# Patient Record
Sex: Female | Born: 1980
Health system: Southern US, Community
[De-identification: ages and names within clinical notes are randomized; demographics above are authoritative.]

## PROBLEM LIST (undated history)

## (undated) DIAGNOSIS — K5190 Ulcerative colitis, unspecified, without complications: Secondary | ICD-10-CM

## (undated) DIAGNOSIS — J301 Allergic rhinitis due to pollen: Secondary | ICD-10-CM

## (undated) DIAGNOSIS — K219 Gastro-esophageal reflux disease without esophagitis: Secondary | ICD-10-CM

## (undated) HISTORY — DX: Allergic rhinitis due to pollen: J30.1

## (undated) HISTORY — DX: Gastro-esophageal reflux disease without esophagitis: K21.9

## (undated) HISTORY — DX: Ulcerative colitis, unspecified, without complications: K51.90

---

## 2004-07-02 ENCOUNTER — Inpatient Hospital Stay: Payer: Self-pay

## 2007-10-21 ENCOUNTER — Emergency Department: Payer: Self-pay | Admitting: Emergency Medicine

## 2009-09-02 IMAGING — CR RIGHT ANKLE - COMPLETE 3+ VIEW
1 series · 5 of 5 positions shown · non-contrast
Comparison: none

REASON FOR EXAM: fall
COMMENTS:

[Series 1: view not recorded · 0.17mm/px · 5 of 5 slices shown]
[im 1/5]
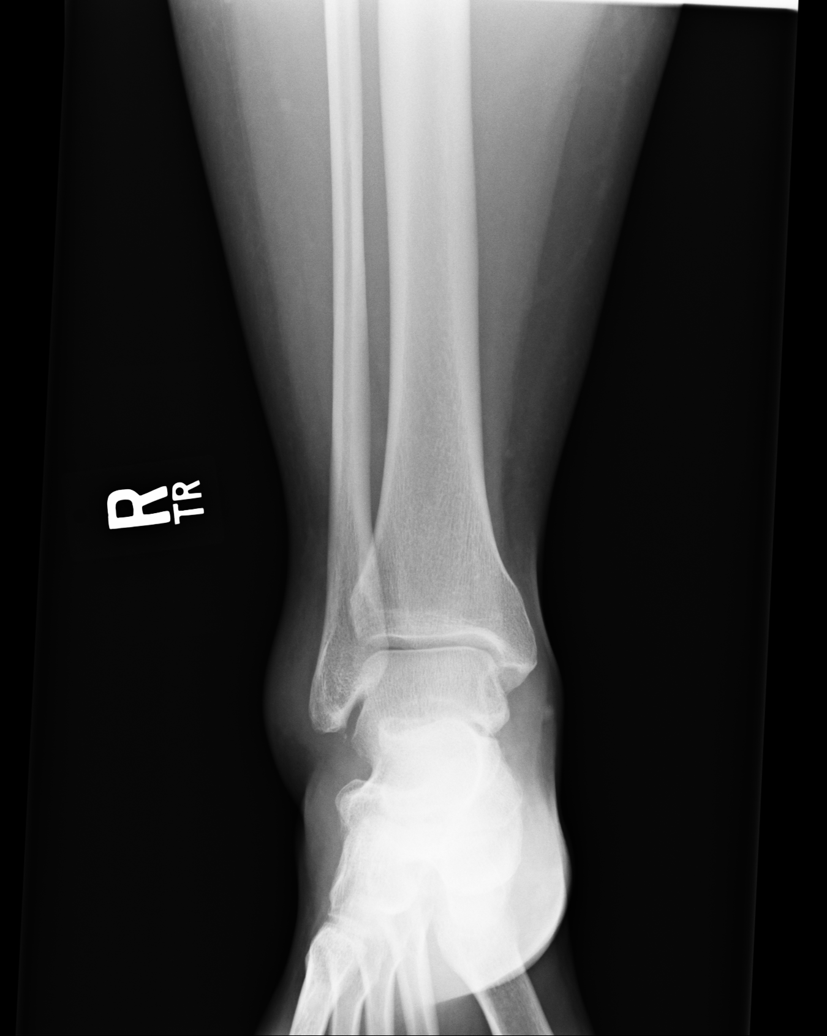
[im 2/5]
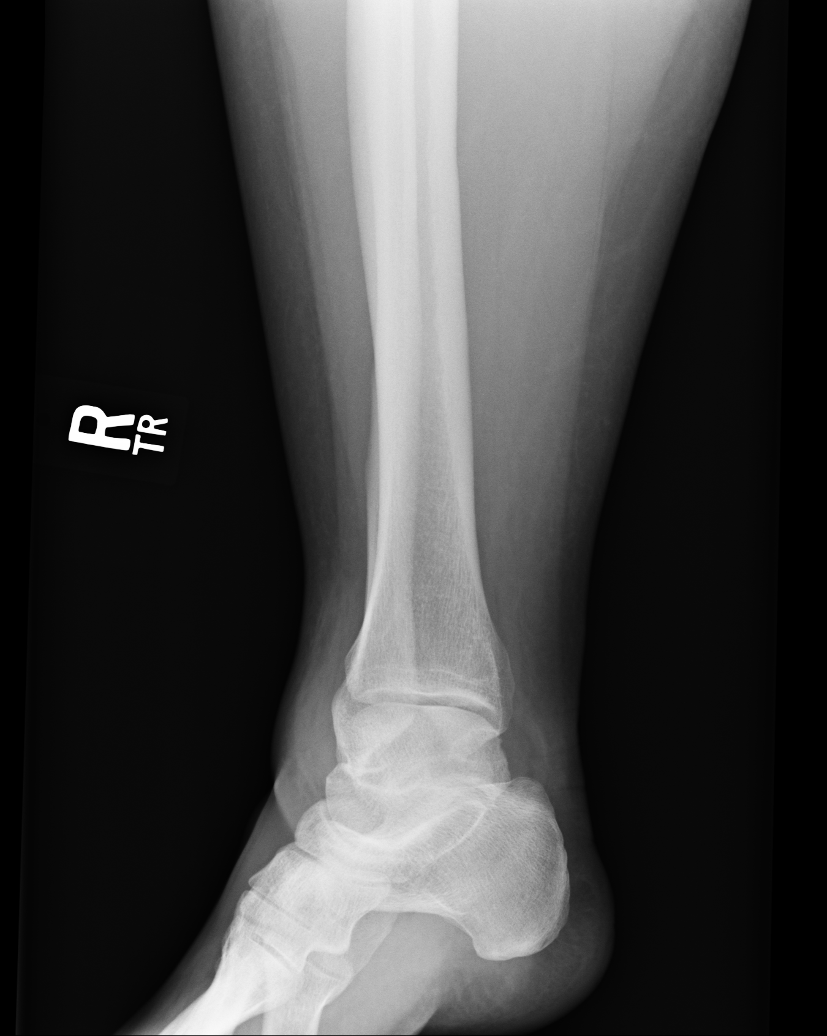
[im 3/5]
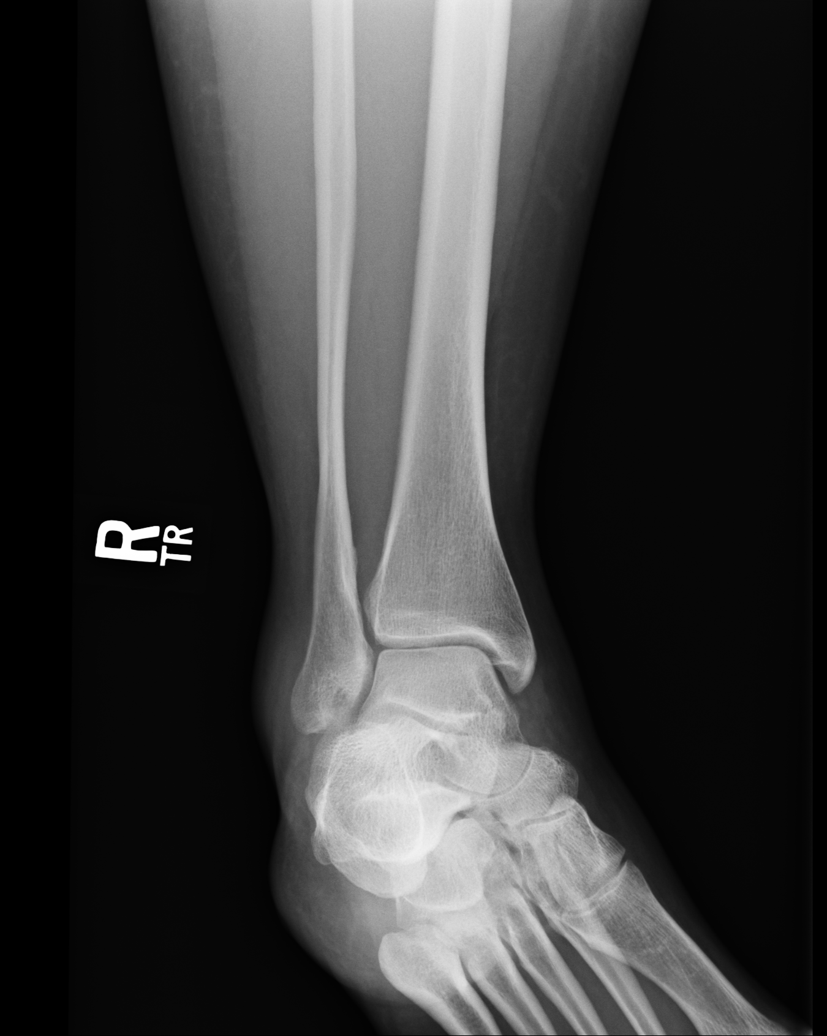
[im 4/5]
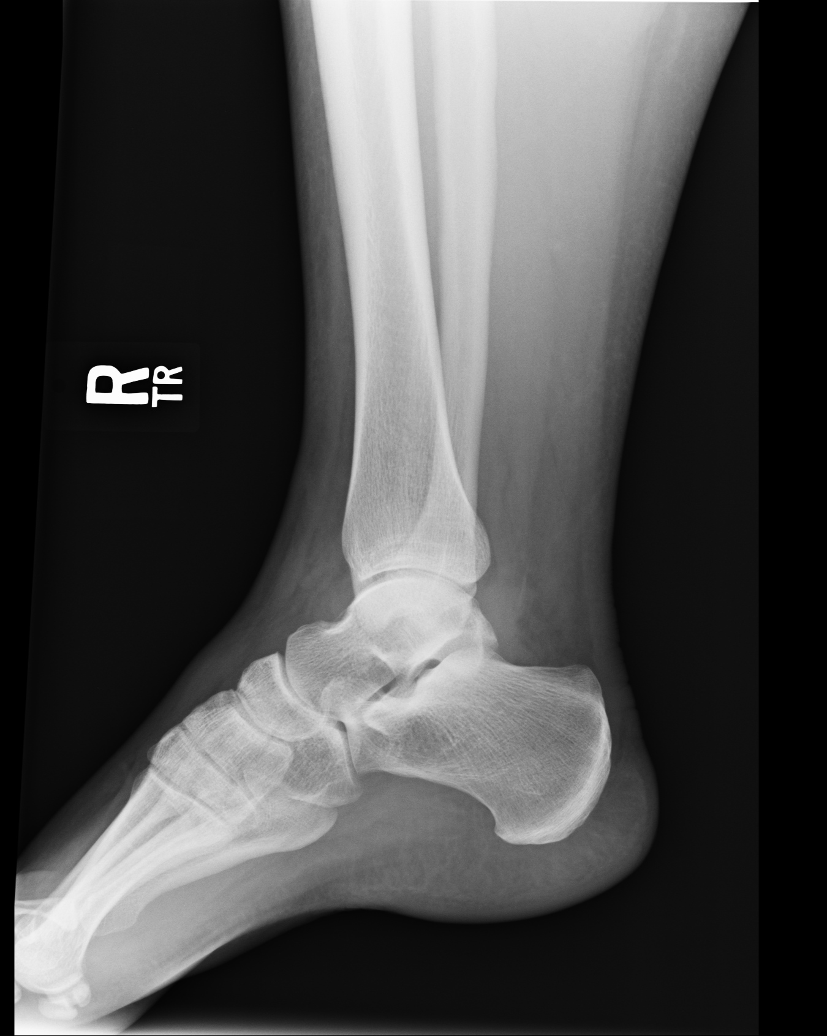
[im 5/5]
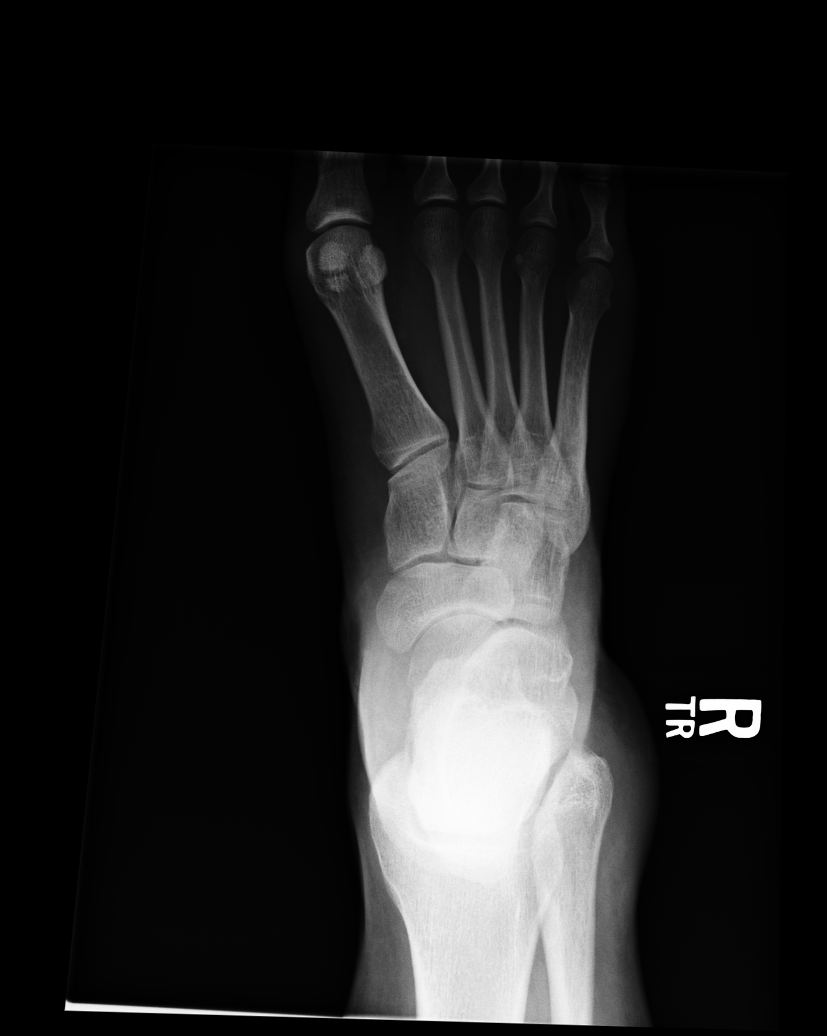

[5 of 5 positions shown; findings below may reference images not displayed]

PROCEDURE:     DXR - DXR ANKLE RIGHT COMPLETE  - October 21, 2007  [DATE]

RESULT:     There is a large amount of soft tissue swelling over the lateral
malleolus. Subtle bony density just inferior to the tip of the lateral
malleolus likely reflects a tiny avulsion fracture fragment. The ankle joint
mortise is preserved. The medial and posterior malleoli appear intact.
IMPRESSION: There is considerable soft tissue swelling over the lateral
malleolus and there is likely a tiny avulsion from the tip of the distal
fibula. Elsewhere I see no acute abnormality.

## 2012-07-29 ENCOUNTER — Ambulatory Visit (INDEPENDENT_AMBULATORY_CARE_PROVIDER_SITE_OTHER): Payer: BC Managed Care – PPO | Admitting: Family Medicine

## 2012-07-29 ENCOUNTER — Encounter: Payer: Self-pay | Admitting: Family Medicine

## 2012-07-29 VITALS — BP 128/84 | HR 96 | Temp 98.0°F | Ht 66.0 in | Wt 293.2 lb

## 2012-07-29 DIAGNOSIS — F411 Generalized anxiety disorder: Secondary | ICD-10-CM | POA: Insufficient documentation

## 2012-07-29 DIAGNOSIS — E669 Obesity, unspecified: Secondary | ICD-10-CM | POA: Insufficient documentation

## 2012-07-29 DIAGNOSIS — L989 Disorder of the skin and subcutaneous tissue, unspecified: Secondary | ICD-10-CM

## 2012-07-29 DIAGNOSIS — L301 Dyshidrosis [pompholyx]: Secondary | ICD-10-CM | POA: Insufficient documentation

## 2012-07-29 MED ORDER — FLUOCINONIDE-E 0.05 % EX CREA: TOPICAL_CREAM | Freq: Two times a day (BID) | CUTANEOUS | Status: DC

## 2012-07-29 NOTE — Progress Notes (Signed)
Subjective:    Patient ID: Veronica Conway, female    DOB: Dec 11, 1980, 32 y.o.   MRN: [id number]  HPI  32 yo G2P2 here to establish care.  She has not been to a doctor since her 74 year old daughter was born.  1.  Rash on her right middle finger- has been on multiple fingers- gets better and worse for past 7 months.  Itchy, no known trauma or contact with skin irritant.  OTC hydrocortisone does help a little.  No rash on bottom of feet.  2.  Anxiety- intermittent episodes of anxiety.  Currently feels ok and would like to discuss this in the future.  3.  Lesion on right upper arm- has been there for years but now growing and itchy.  Does not bleed.  Non painful.  4.  Obesity- wants to lose weight but has never tried any diets.  She is leary of "fad diets." States that carbs are her weakness.  She is not very active.  Last pap smear was in 2005.  No h/o abnormal pap smears.  Sexually active with husband only.  Patient Active Problem List  Diagnosis  . Dyshidrotic eczema  . Anxiety state, unspecified  . Obesity, unspecified   Past Medical History  Diagnosis Date  . GERD (gastroesophageal reflux disease)   . Hay fever   . Vaginal delivery 2003, 2006   History reviewed. No pertinent past surgical history. History  Substance Use Topics  . Smoking status: Never Smoker   . Smokeless tobacco: Never Used  . Alcohol Use: Yes     Comment: occasional   Family History  Problem Relation Age of Onset  . Heart disease Father   . Mental illness Paternal Grandmother   . Cancer Paternal Grandmother   . Alcohol abuse Paternal Grandfather   . Heart disease Paternal Grandfather   . Hypertension Paternal Grandfather    No Known Allergies No current outpatient prescriptions on file prior to visit.   No current facility-administered medications on file prior to visit.   The PMH, PSH, Social History, Family History, Medications, and allergies have been reviewed in [facility], and have been updated if  relevant.   Review of Systems See HPI    Objective:   Physical Exam BP 128/84  Pulse 96  Temp(Src) 98 F (36.7 C) (Oral)  Ht 5\' 6"  (1.676 m)  Wt 293 lb 4 oz (133.017 kg)  BMI 47.35 kg/m2  LMP 07/19/2012  General:  obese,well-nourished,in no acute distress; alert,appropriate and cooperative throughout examination Head:  normocephalic and atraumatic.   Eyes:  vision grossly intact, pupils equal, pupils round, and pupils reactive to light.   Ears:  R ear normal and L ear normal.   Nose:  no external deformity.   Mouth:  good dentition.   Neck:  No deformities, masses, or tenderness noted. Abdomen:  Bowel sounds positive,abdomen soft and non-tender without masses, organomegaly or hernias noted. Msk:  No deformity or scoliosis noted of thoracic or lumbar spine.   Extremities:  No clubbing, cyanosis, edema, or deformity noted with normal full range of motion of all joints.   Neurologic:  alert & oriented X3 and gait normal.   Skin:  Raised erythematous lesions with irregular borders right arm. Raised papular rash on volar aspect of right third digit. Psych:  Cognition and judgment appear intact. Alert and cooperative with normal attention span and concentration. No apparent delusions, illusions, hallucinations      Assessment & Plan:  1. Skin lesion ?scc  vs histiocytic lesion.  Refer to derm for biopsy/reomval - Ambulatory referral to Dermatology  2. Dyshidrotic eczema New- treat with 10 day course of lidex twice daily. Call or return to clinic prn if these symptoms worsen or fail to improve as anticipated.   3. Anxiety state, unspecified Intermittent and she would prefer not to discuss today.  Will readdress at [facility].  4. Morbid obesity Deteriorated.  Discussed eat right diet- handout given.  When she returns for CPX, will check routine labs.

## 2012-07-29 NOTE — Patient Instructions (Addendum)
Please stop by to see Veronica Conway on your way out to set up your dermatology referral. Let's try Lidex twice daily for next 10 days on your finger.  Please call me with an update in 2 weeks.  Please come back to see me at your convenience for complete physical/ pap smear.

## 2012-08-01 ENCOUNTER — Telehealth: Payer: Self-pay

## 2012-08-01 NOTE — Telephone Encounter (Signed)
Pt said last night started lidex cream to eczema on rt hand middle finger. Pt noticed bruised or discoloration to the right side of the right middle finger. Pt does not remember injuring or hitting finger and wondered if could be reaction to medication. Pt does not have any problem breathing. Pt can use finger and no pain noted.Please advise.[facility].

## 2012-08-01 NOTE — Telephone Encounter (Signed)
Left message on voice mail, per patient's request, advising patient as instructed.

## 2012-08-01 NOTE — Telephone Encounter (Signed)
Left message asking patient to call back

## 2012-08-01 NOTE — Telephone Encounter (Signed)
Please STOP using it.  Let's wait for her dermatology appt for their diagnosis and treatment before we start anything else.

## 2012-09-05 ENCOUNTER — Other Ambulatory Visit: Payer: Self-pay | Admitting: Family Medicine

## 2012-09-05 DIAGNOSIS — Z0000 Encounter for general adult medical examination without abnormal findings: Secondary | ICD-10-CM

## 2012-09-05 DIAGNOSIS — Z136 Encounter for screening for cardiovascular disorders: Secondary | ICD-10-CM

## 2012-09-09 ENCOUNTER — Ambulatory Visit (INDEPENDENT_AMBULATORY_CARE_PROVIDER_SITE_OTHER): Payer: BC Managed Care – PPO | Admitting: Family Medicine

## 2012-09-09 ENCOUNTER — Encounter: Payer: Self-pay | Admitting: Family Medicine

## 2012-09-09 VITALS — BP 116/88 | HR 138 | Temp 98.3°F | Wt 283.0 lb

## 2012-09-09 DIAGNOSIS — J020 Streptococcal pharyngitis: Secondary | ICD-10-CM | POA: Insufficient documentation

## 2012-09-09 LAB — POCT RAPID STREP A (OFFICE): Rapid Strep A Screen: POSITIVE — AB

## 2012-09-09 MED ORDER — PENICILLIN V POTASSIUM 500 MG PO TABS: 500.0000 mg | ORAL_TABLET | Freq: Three times a day (TID) | ORAL | Status: AC

## 2012-09-09 NOTE — Progress Notes (Signed)
SUBJECTIVE: 32 y.o. female with sore throat, myalgias, swollen glands, headache and fever for 3 days. No history of rheumatic fever. Other symptoms: fever.  Patient Active Problem List  Diagnosis  . Dyshidrotic eczema  . Anxiety state, unspecified  . Obesity, unspecified  . Strep pharyngitis   Past Medical History  Diagnosis Date  . GERD (gastroesophageal reflux disease)   . Hay fever   . Vaginal delivery 2003, 2006   No past surgical history on file. History  Substance Use Topics  . Smoking status: Never Smoker   . Smokeless tobacco: Never Used  . Alcohol Use: Yes     Comment: occasional   Family History  Problem Relation Age of Onset  . Heart disease Father   . Mental illness Paternal Grandmother   . Cancer Paternal Grandmother   . Alcohol abuse Paternal Grandfather   . Heart disease Paternal Grandfather   . Hypertension Paternal Grandfather    No Known Allergies Current Outpatient Prescriptions on File Prior to Visit  Medication Sig Dispense Refill  . fluocinonide-emollient (LIDEX-E) 0.05 % cream Apply topically 2 (two) times daily.  30 g  0   No current facility-administered medications on file prior to visit.   The PMH, PSH, Social History, Family History, Medications, and allergies have been reviewed in [facility], and have been updated if relevant.  OBJECTIVE:  BP 116/88  Pulse 138  Temp(Src) 98.3 F (36.8 C)  Wt 283 lb (128.368 kg)  BMI 45.7 kg/m2  Appears alert, well appearing, and in no distress. Ears: bilateral TM's and external ear canals normal Oropharynx: tonsils hypertrophied with exudate Neck: supple, no significant adenopathy Lungs: clear to auscultation, no wheezes, rales or rhonchi, symmetric air entry Rapid Strep test is positive  ASSESSMENT: Streptococcal pharyngitis  PLAN: Per orders. Gargle, use acetaminophen or other OTC analgesic, and take Rx fully as prescribed. Call if other family members develop similar symptoms. See prn.

## 2012-09-09 NOTE — Patient Instructions (Addendum)
Strep Throat Strep throat is an infection of the throat caused by a bacteria named Streptococcus pyogenes. Your caregiver may call the infection streptococcal "tonsillitis" or "pharyngitis" depending on whether there are signs of inflammation in the tonsils or back of the throat. Strep throat is most common in children from 68 to 32 years old during the cold months of the year, but it can occur in people of any age during any season. This infection is spread from person to person (contagious) through coughing, sneezing, or other close contact. SYMPTOMS   Fever or chills.  Painful, swollen, red tonsils or throat.  Pain or difficulty when swallowing.  White or yellow spots on the tonsils or throat.  Swollen, tender lymph nodes or "glands" of the neck or under the jaw.  Red rash all over the body (rare). DIAGNOSIS  Many different infections can cause the same symptoms. A test must be done to confirm the diagnosis so the right treatment can be given. A "rapid strep test" can help your caregiver make the diagnosis in a few minutes. If this test is not available, a light swab of the infected area can be used for a throat culture test. If a throat culture test is done, results are usually available in a day or two. TREATMENT  Strep throat is treated with antibiotic medicine- penicillin 500 mg three times daily x 10 days. HOME CARE INSTRUCTIONS   Gargle with 1 tsp of salt in 1 cup of warm water, 3 to 4 times per day or as needed for comfort.  Family members who also have a sore throat or fever should be tested for strep throat and treated with antibiotics if they have the strep infection.  Make sure everyone in your household washes their hands well.  Do not share food, drinking cups, or personal items that could cause the infection to spread to others.  You may need to eat a soft food diet until your sore throat gets better.  Drink enough water and fluids to keep your urine clear or pale  yellow. This will help prevent dehydration.  Get plenty of rest.  Stay home from school, daycare, or work until you have been on antibiotics for 24 hours.  Only take over-the-counter or prescription medicines for pain, discomfort, or fever as directed by your caregiver.  If antibiotics are prescribed, take them as directed. Finish them even if you start to feel better. SEEK MEDICAL CARE IF:   The glands in your neck continue to enlarge.  You develop a rash, cough, or earache.  You cough up green, yellow-brown, or bloody sputum.  You have pain or discomfort not controlled by medicines.  Your problems seem to be getting worse rather than better. SEEK IMMEDIATE MEDICAL CARE IF:   You develop any new symptoms such as vomiting, severe headache, stiff or painful neck, chest pain, shortness of breath, or trouble swallowing.  You develop severe throat pain, drooling, or changes in your voice.  You develop swelling of the neck, or the skin on the neck becomes red and tender.  You have a fever.  You develop signs of dehydration, such as fatigue, dry mouth, and decreased urination.  You become increasingly sleepy, or you cannot wake up completely. Document Released: 05/12/2000 Document Revised: 08/07/2011 Document Reviewed: 07/14/2010 [employer] Patient Information 2013 [place], [place].

## 2012-09-11 ENCOUNTER — Other Ambulatory Visit: Payer: BC Managed Care – PPO

## 2012-09-12 ENCOUNTER — Telehealth: Payer: Self-pay | Admitting: Family Medicine

## 2012-09-12 NOTE — Telephone Encounter (Signed)
Patient Information:  Caller Name: [place]  Phone: [phone]  Patient: Veronica Conway, Veronica Conway  Gender: Female  DOB: October 30, 1980  Age: 32 Years  PCP: Ruthe Mannan [facility])  Pregnant: No  Office Follow Up:  Does the office need to follow up with this patient?: No  Instructions For The Office: N/A   Symptoms  Reason For Call & Symptoms: Diagnosed with strep throat 09/09/12.  Feels dizzy, light headed, has difficulty focusing since she returned to work on 09/10/12.  Headache rated at 2/10.  Reviewed Health History In EMR: Yes  Reviewed Medications In EMR: Yes  Reviewed Allergies In EMR: Yes  Reviewed Surgeries / Procedures: Yes  Date of Onset of Symptoms: 09/10/2012 OB / GYN:  LMP: 08/17/2012  Guideline(s) Used:  Dizziness  Disposition Per Guideline:   Home Care  Reason For Disposition Reached:   Poor fluid intake probably causing dizziness  Advice Given:  Temporary Dizziness  is usually a harmless symptom. It can be caused by not drinking enough water during sports or hot weather. It can also be caused by skipping a meal, too much sun exposure, standing up suddenly, standing too long in one place or even a viral illness.  Drink Fluids:  Drink several glasses of fruit juice, other clear fluids, or water. This will improve hydration and blood glucose. If you have a fever or have had heat exposure, make sure the fluids are cold.  Rest for 1-2 Hours:  Lie down with feet elevated for 1 hour. This will improve blood flow and increase blood flow to the brain.  Rest for 1-2 Hours:  Lie down with feet elevated for 1 hour. This will improve blood flow and increase blood flow to the brain.  Stand Up Slowly:  In the mornings, sit up for a few minutes before you stand up. That will help your blood flow make the adjustment.  If you have to stand up for long periods of time, contract and relax your leg muscles to help pump the blood back to the heart.  Sit down or lie down if you feel  dizzy.  Call Back If:  Still feel dizzy after 2 hours of rest and fluids  Passes out (faints)  You become worse.  Patient Will Follow Care Advice:  YES

## 2012-09-16 ENCOUNTER — Encounter: Payer: Self-pay | Admitting: Radiology

## 2012-09-16 ENCOUNTER — Other Ambulatory Visit: Payer: BC Managed Care – PPO

## 2012-09-18 ENCOUNTER — Encounter: Payer: BC Managed Care – PPO | Admitting: Family Medicine

## 2012-12-06 ENCOUNTER — Encounter: Payer: Self-pay | Admitting: Family Medicine

## 2012-12-06 ENCOUNTER — Ambulatory Visit (INDEPENDENT_AMBULATORY_CARE_PROVIDER_SITE_OTHER): Payer: BC Managed Care – PPO | Admitting: Family Medicine

## 2012-12-06 VITALS — BP 126/78 | HR 122 | Temp 98.4°F | Wt 294.2 lb

## 2012-12-06 DIAGNOSIS — K5130 Ulcerative (chronic) rectosigmoiditis without complications: Secondary | ICD-10-CM | POA: Insufficient documentation

## 2012-12-06 DIAGNOSIS — K921 Melena: Secondary | ICD-10-CM

## 2012-12-06 NOTE — Progress Notes (Signed)
  Subjective:    Patient ID: Veronica Conway, female    DOB: 1980-07-09, 32 y.o.   MRN: [id number]  HPI  Very pleasant 32 yo female here with rectal bleeding.  Bright red blood and mucous with every BM x 2 months.  Increased urgency and frequency.  Stools loose but not watery. No black stools. +indigestion and fatigue.  No known family h/o IBD.  She is unsure if any foods make it worse.  Had similar episode that lasted 6 months approx 6 years ago but resolved on its own.  Patient Active Problem List   Diagnosis Date Noted  . Blood in stool 12/06/2012  . Strep pharyngitis 09/09/2012  . Dyshidrotic eczema 07/29/2012  . Anxiety state, unspecified 07/29/2012  . Obesity, unspecified 07/29/2012   Past Medical History  Diagnosis Date  . GERD (gastroesophageal reflux disease)   . Hay fever   . Vaginal delivery 2003, 2006   No past surgical history on file. History  Substance Use Topics  . Smoking status: Never Smoker   . Smokeless tobacco: Never Used  . Alcohol Use: Yes     Comment: occasional   Family History  Problem Relation Age of Onset  . Heart disease Father   . Mental illness Paternal Grandmother   . Cancer Paternal Grandmother   . Alcohol abuse Paternal Grandfather   . Heart disease Paternal Grandfather   . Hypertension Paternal Grandfather    No Known Allergies Current Outpatient Prescriptions on File Prior to Visit  Medication Sig Dispense Refill  . fluocinonide-emollient (LIDEX-E) 0.05 % cream Apply topically 2 (two) times daily.  30 g  0   No current facility-administered medications on file prior to visit.   The PMH, PSH, Social History, Family History, Medications, and allergies have been reviewed in [facility], and have been updated if relevant.   Review of Systems See HPI No n/v   No pain with defecation Objective:   Physical Exam BP 126/78  Pulse 122  Temp(Src) 98.4 F (36.9 C) (Oral)  Wt 294 lb 4 oz (133.471 kg)  BMI 47.52 kg/m2  SpO2 97%  LMP  11/15/2012 Gen:  Pleasant, Obese, well hydrated and non toxic Rectal exam:  Pt declined    Assessment & Plan:  1. Blood in stool Given duration and other symptoms, unlikely hemorrhoids or anal fissure. More concerning for IBD. Will refer to GI for colonoscopy.   - Ambulatory referral to Gastroenterology

## 2012-12-06 NOTE — Patient Instructions (Addendum)
Great to see you. Please stop by to see Shirlee Limerick on your way out to set up your referral.  pepcid 20 mg daily for next 2 weeks. Exclude gas producing foods (beans, onions, celery, carrots, raisins, bananas, apricots, prunes, brussel sprouts, wheat germ, pretzels)

## 2012-12-09 ENCOUNTER — Encounter: Payer: Self-pay | Admitting: Internal Medicine

## 2012-12-09 ENCOUNTER — Encounter: Payer: Self-pay | Admitting: Family Medicine

## 2012-12-09 ENCOUNTER — Other Ambulatory Visit (INDEPENDENT_AMBULATORY_CARE_PROVIDER_SITE_OTHER): Payer: BC Managed Care – PPO

## 2012-12-09 ENCOUNTER — Encounter: Payer: Self-pay | Admitting: Physician Assistant

## 2012-12-09 ENCOUNTER — Ambulatory Visit (INDEPENDENT_AMBULATORY_CARE_PROVIDER_SITE_OTHER): Payer: BC Managed Care – PPO | Admitting: Physician Assistant

## 2012-12-09 VITALS — BP 124/74 | HR 104 | Ht 66.0 in | Wt 293.0 lb

## 2012-12-09 DIAGNOSIS — Z136 Encounter for screening for cardiovascular disorders: Secondary | ICD-10-CM

## 2012-12-09 DIAGNOSIS — R5381 Other malaise: Secondary | ICD-10-CM

## 2012-12-09 DIAGNOSIS — R152 Fecal urgency: Secondary | ICD-10-CM

## 2012-12-09 DIAGNOSIS — K625 Hemorrhage of anus and rectum: Secondary | ICD-10-CM

## 2012-12-09 DIAGNOSIS — R198 Other specified symptoms and signs involving the digestive system and abdomen: Secondary | ICD-10-CM

## 2012-12-09 DIAGNOSIS — R109 Unspecified abdominal pain: Secondary | ICD-10-CM

## 2012-12-09 DIAGNOSIS — R194 Change in bowel habit: Secondary | ICD-10-CM

## 2012-12-09 DIAGNOSIS — R5383 Other fatigue: Secondary | ICD-10-CM

## 2012-12-09 DIAGNOSIS — Z0000 Encounter for general adult medical examination without abnormal findings: Secondary | ICD-10-CM

## 2012-12-09 LAB — LIPID PANEL
Cholesterol: 210 mg/dL — ABNORMAL HIGH (ref 0–200)
HDL: 32.2 mg/dL — ABNORMAL LOW (ref >39.00)
Triglycerides: 292 mg/dL — ABNORMAL HIGH (ref 0.0–149.0)

## 2012-12-09 LAB — CBC WITH DIFFERENTIAL/PLATELET
Basophils Relative: 0.3 % (ref 0.0–3.0)
Eosinophils Relative: 2.9 % (ref 0.0–5.0)
Hemoglobin: 12.3 g/dL (ref 12.0–15.0)
MCV: 90.2 fl (ref 78.0–100.0)
Monocytes Absolute: 0.4 10*3/uL (ref 0.1–1.0)
Neutro Abs: 5.8 10*3/uL (ref 1.4–7.7)
Neutrophils Relative %: 66.9 % (ref 43.0–77.0)
RBC: 4.03 Mil/uL (ref 3.87–5.11)
WBC: 8.7 10*3/uL (ref 4.5–10.5)

## 2012-12-09 LAB — BASIC METABOLIC PANEL
BUN: 12 mg/dL (ref 6–23)
CO2: 25 mEq/L (ref 19–32)
Calcium: 9.5 mg/dL (ref 8.4–10.5)
Glucose, Bld: 87 mg/dL (ref 70–99)
Potassium: 4.2 mEq/L (ref 3.5–5.1)
Sodium: 136 mEq/L (ref 135–145)

## 2012-12-09 LAB — COMPREHENSIVE METABOLIC PANEL
BUN: 12 mg/dL (ref 6–23)
CO2: 27 mEq/L (ref 19–32)
Calcium: 9.5 mg/dL (ref 8.4–10.5)
Chloride: 105 mEq/L (ref 96–112)
Creatinine, Ser: 0.9 mg/dL (ref 0.4–1.2)
GFR: 80.17 mL/min (ref >60.00)

## 2012-12-09 MED ORDER — MOVIPREP 100 G PO SOLR: 1.0000 | Freq: Once | ORAL | Status: DC

## 2012-12-09 MED ORDER — DICYCLOMINE HCL 10 MG PO CAPS: 10.0000 mg | ORAL_CAPSULE | Freq: Three times a day (TID) | ORAL | Status: DC

## 2012-12-09 NOTE — Progress Notes (Addendum)
Subjective:    Patient ID: Veronica Conway, female    DOB: 11-26-1980, 32 y.o.   MRN: [id number]  HPI Veronica Conway is a pleasant 32 year old white female generally in good health with history of anxiety and obesity who is referred by her PCP today Dr. Ruthe Mannan for evaluation of 2 month history of change in bowel habits with rectal bleeding. Patient says she had a similar episode about 6 years ago that lasted for about 6 months with intermittent rectal bleeding and looser stools that eventually resolved without any treatment. She says she had onset of her current symptoms about 2 months ago and initially started noticing bright red blood with her bowel movements.  Shortly after that started she's began noticing intermittent clots and mucus and is now having blood and mucus with every bowel movement. She says her stools are soft and more frequent than usual in general he is having 3-4 bowel movements per day. She has a lot of urgency and says she makes frequent trips to the bathroom feeling as if she has to have a bowel movement but will just pass  flatus and blood. She denies any rectal pain but has been having some lower abdominal  cramping. She has not had any documented fevers or chills, she does admit to feeling  fatigued recently. Her appetite has been okay ,feels as if she's gained a few pounds. Family history is negative for GI diseases far she is aware, she says her family doesn't go to doctors and often self diagnose, She has not had any recent labs.    Review of Systems  Constitutional: Positive for fatigue and unexpected weight change.  HENT: Negative.   Eyes: Negative.   Respiratory: Negative.   Cardiovascular: Negative.   Gastrointestinal: Positive for abdominal pain, diarrhea and blood in stool.  Endocrine: Negative.   Genitourinary: Negative.   Musculoskeletal: Negative.   Skin: Negative.   Allergic/Immunologic: Negative.   Neurological: Negative.   Hematological: Negative.    Psychiatric/Behavioral: The patient is nervous/anxious.    Outpatient Prescriptions Prior to Visit  Medication Sig Dispense Refill  . fluocinonide-emollient (LIDEX-E) 0.05 % cream Apply topically 2 (two) times daily.  30 g  0   No facility-administered medications prior to visit.      No Known Allergies Patient Active Problem List   Diagnosis Date Noted  . Blood in stool 12/06/2012  . Strep pharyngitis 09/09/2012  . Dyshidrotic eczema 07/29/2012  . Anxiety state, unspecified 07/29/2012  . Obesity, unspecified 07/29/2012   History  Substance Use Topics  . Smoking status: Never Smoker   . Smokeless tobacco: Never Used  . Alcohol Use: Yes     Comment: occasional   family history includes Alcohol abuse in her paternal grandfather; Breast cancer in her paternal grandmother; Heart disease in her father and paternal grandfather; Hypertension in her paternal grandfather; and Mental illness in her paternal grandmother.  Objective:   Physical Exam  well-developed obese young white female in no acute distress, anxious, pleasant blood pressure 124% he 4 pulse 104 height 5 foot 6 weight 293 BMI 47. HEENT; nontraumatic normocephalic EOMI PERRLA sclera anicteric, Neck; supple no JVD, Cardiovascular; regular rate and rhythm with S1-S2, tachycardia, Pulmonary ;clear bilaterally, Abdomen ;obese soft she is minimally tender in the left mid quadrant left lower quadrant there is no guarding or rebound no palpable mass or hepatosplenomegaly bowel sounds are active, Rectal; exam not done patient extremely anxious, Extremities; no clubbing cyanosis or edema skin warm and dry, Psych; mood  and affect appropriate.        Assessment & Plan:  #47 32 year old female with 2 month history of altered bowel habits with more frequent loose stools and daily passage of blood and mucus with bowel movements. Patient has had lower abdominal cramping and fatigue. Need to rule out IBD , and am suspicious she has a  proctitis given significant tenesmus symptoms #2 morbid obesity-BMI 47 #3 anxiety  Plan; will check CBC with differential, bmet,and CRP today Start Bentyl 10 mg 3 times daily as needed for cramping and urgency She was given samples of Canasa suppositories one each bedtime to use over the next few days until colonoscopy can be completed Have scheduled for colonoscopy with Dr. Rhea Belton -procedure discussed in detail with patient and she is agreeable to proceed.  Addendum: Reviewed and agree with initial management. Beverley Fiedler, MD

## 2012-12-09 NOTE — Patient Instructions (Addendum)
Please go to the basement level to have your labs drawn.  We sent prescriptions to [facility] [place], [place] for the colonoscopy prep and the Bentyl for cramping. You have been scheduled for a colonoscopy with propofol. Please follow written instructions given to you at your visit today.  Please pick up your prep kit at the pharmacy within the next 1-3 days. If you use inhalers (even only as needed), please bring them with you on the day of your procedure. Your physician has requested that you go to www.startemmi.com and enter the access code given to you at your visit today. This web site gives a general overview about your procedure. However, you should still follow specific instructions given to you by our office regarding your preparation for the procedure.

## 2012-12-10 ENCOUNTER — Other Ambulatory Visit: Payer: Self-pay | Admitting: *Deleted

## 2012-12-10 ENCOUNTER — Telehealth: Payer: Self-pay | Admitting: Physician Assistant

## 2012-12-11 ENCOUNTER — Other Ambulatory Visit: Payer: Self-pay | Admitting: *Deleted

## 2012-12-11 MED ORDER — MOVIPREP 100 G PO SOLR: 1.0000 | Freq: Once | ORAL | Status: DC

## 2012-12-16 ENCOUNTER — Encounter: Payer: Self-pay | Admitting: Internal Medicine

## 2012-12-16 ENCOUNTER — Ambulatory Visit (AMBULATORY_SURGERY_CENTER): Payer: BC Managed Care – PPO | Admitting: Internal Medicine

## 2012-12-16 VITALS — BP 96/61 | HR 75 | Temp 98.3°F | Resp 24 | Ht 66.0 in | Wt 293.0 lb

## 2012-12-16 DIAGNOSIS — K5289 Other specified noninfective gastroenteritis and colitis: Secondary | ICD-10-CM

## 2012-12-16 DIAGNOSIS — K6389 Other specified diseases of intestine: Secondary | ICD-10-CM

## 2012-12-16 DIAGNOSIS — K921 Melena: Secondary | ICD-10-CM

## 2012-12-16 DIAGNOSIS — K625 Hemorrhage of anus and rectum: Secondary | ICD-10-CM

## 2012-12-16 MED ORDER — MESALAMINE 4 G RE ENEM: 4.0000 g | ENEMA | Freq: Every day | RECTAL | Status: DC

## 2012-12-16 MED ORDER — SODIUM CHLORIDE 0.9 % IV SOLN: 500.0000 mL | INTRAVENOUS | Status: DC

## 2012-12-16 NOTE — Progress Notes (Signed)
Called to room to assist during endoscopic procedure.  Patient ID and intended procedure confirmed with present staff. Received instructions for my participation in the procedure from the performing physician.  

## 2012-12-16 NOTE — Progress Notes (Signed)
Procedure ends, to recovery, report given and VSS. 

## 2012-12-16 NOTE — Progress Notes (Signed)
Patient did not experience any of the following events: a burn prior to discharge; a fall within the facility; wrong site/side/patient/procedure/implant event; or a hospital transfer or hospital admission upon discharge from the facility. (G8907) Patient did not have preoperative order for IV antibiotic SSI prophylaxis. (G8918)  

## 2012-12-16 NOTE — Op Note (Signed)
[facility] [address] [place], [address]   COLONOSCOPY PROCEDURE REPORT  PATIENT: Veronica Conway, Veronica Conway  MR#: [id number] BIRTHDATE: Dec 12, 1980 , 32  yrs. old GENDER: Female ENDOSCOPIST: Beverley Fiedler, MD REFERRED NU:UVOZD Aron, M.D.  Amy Esterwood, PA-C PROCEDURE DATE:  12/16/2012 PROCEDURE:   Colonoscopy with biopsy ASA CLASS:   Class I INDICATIONS:Rectal Bleeding, Change in bowel habits, and Abdominal pain. MEDICATIONS: MAC sedation, administered by CRNA and propofol (Diprivan) 400mg  IV  DESCRIPTION OF PROCEDURE:   After the risks benefits and alternatives of the procedure were thoroughly explained, informed consent was obtained.  A digital rectal exam revealed no rectal mass and no abnormalities of the perianal region.   The LB [id number]  endoscope was introduced through the anus and advanced to the cecum, which was identified by both the appendix and ileocecal valve. No adverse events experienced.   The quality of the prep was good, using MoviPrep  The instrument was then slowly withdrawn as the colon was fully examined.  COLON FINDINGS: Circumferential proctosigmoiditis was found in the rectosigmoid colon and rectum. The involved segment begins immediately in the rectum and extend in circumferential fashion to 18 cm from the dentate line, and then becomes patchy until approximately 25 cm from the dentate line.  The mucosa was edematous, erythematous, friable and had granularity, loss of vascularity and superficial ulcers.  This was likely consistent with ulcerative colitis disease.  Multiple biopsies of the area were performed using cold forceps.   The colonic mucosa appeared normal at the cecum, in the ascending colon, transverse colon, descending colon, and proximal sigmoid colon (to 25 cm from the dentate line).   There were a few scattered diverticula noted in the sigmoid colon.  Retroflexion was not performed due to proctitis. The time to cecum=6  minutes 18 seconds.  Withdrawal time=9 minutes 36 seconds.  The scope was withdrawn and the procedure completed. COMPLICATIONS: There were no complications.  ENDOSCOPIC IMPRESSION: 1.   Colitis was found in the rectosigmoid colon and rectum; multiple biopsies of the area were performed using cold forceps 2.   The colonic mucosa appeared normal at the cecum, in the ascending colon, transverse colon, descending colon, and proximal sigmoid colon to 25 cm from the dentate line 3.   Few scattered sigmoid diverticula  RECOMMENDATIONS: 1.  Await pathology results 2.  Avoid NSAIDS 3.  Begin Rowasa enema 4g at bedtime each night for 6 weeks 4.  Office follow-up in 2-3 weeks   eSigned:  Beverley Fiedler, MD 12/16/2012 2:07 PM   cc: The Patient and Ruthe Mannan MD

## 2012-12-16 NOTE — Patient Instructions (Addendum)
Discharge instructions given with verbal understanding. Biopsies taken. Resume previous medications. Avoid NSAIDS. Office visit in 2-3 weeks. Office will schedule. YOU HAD AN ENDOSCOPIC PROCEDURE TODAY AT THE [facility]: Refer to the procedure report that was given to you for any specific questions about what was found during the examination.  If the procedure report does not answer your questions, please call your gastroenterologist to clarify.  If you requested that your care partner not be given the details of your procedure findings, then the procedure report has been included in a sealed envelope for you to review at your convenience later.  YOU SHOULD EXPECT: Some feelings of bloating in the abdomen. Passage of more gas than usual.  Walking can help get rid of the air that was put into your GI tract during the procedure and reduce the bloating. If you had a lower endoscopy (such as a colonoscopy or flexible sigmoidoscopy) you may notice spotting of blood in your stool or on the toilet paper. If you underwent a bowel prep for your procedure, then you may not have a normal bowel movement for a few days.  DIET: Your first meal following the procedure should be a light meal and then it is ok to progress to your normal diet.  A half-sandwich or bowl of soup is an example of a good first meal.  Heavy or fried foods are harder to digest and may make you feel nauseous or bloated.  Likewise meals heavy in dairy and vegetables can cause extra gas to form and this can also increase the bloating.  Drink plenty of fluids but you should avoid alcoholic beverages for 24 hours.  ACTIVITY: Your care partner should take you home directly after the procedure.  You should plan to take it easy, moving slowly for the rest of the day.  You can resume normal activity the day after the procedure however you should NOT DRIVE or use heavy machinery for 24 hours (because of the sedation medicines used during the  test).    SYMPTOMS TO REPORT IMMEDIATELY: A gastroenterologist can be reached at any hour.  During normal business hours, 8:30 AM to 5:00 PM Monday through Friday, call [phone].  After hours and on weekends, please call the GI answering service at [phone] who will take a message and have the physician on call contact you.   Following lower endoscopy (colonoscopy or flexible sigmoidoscopy):  Excessive amounts of blood in the stool  Significant tenderness or worsening of abdominal pains  Swelling of the abdomen that is new, acute  Fever of 100F or higher  FOLLOW UP: If any biopsies were taken you will be contacted by phone or by letter within the next 1-3 weeks.  Call your gastroenterologist if you have not heard about the biopsies in 3 weeks.  Our staff will call the home number listed on your records the next business day following your procedure to check on you and address any questions or concerns that you may have at that time regarding the information given to you following your procedure. This is a courtesy call and so if there is no answer at the home number and we have not heard from you through the emergency physician on call, we will assume that you have returned to your regular daily activities without incident.  SIGNATURES/CONFIDENTIALITY: You and/or your care partner have signed paperwork which will be entered into your electronic medical record.  These signatures attest to the fact that that the  information above on your After Visit Summary has been reviewed and is understood.  Full responsibility of the confidentiality of this discharge information lies with you and/or your care-partner.

## 2012-12-17 ENCOUNTER — Telehealth: Payer: Self-pay | Admitting: *Deleted

## 2012-12-17 NOTE — Telephone Encounter (Signed)
  Follow up Call-  Call back number 12/16/2012  Post procedure Call Back phone  # [phone]  Permission to leave phone message Yes    Unable to leave message; mailbox not set up yet

## 2012-12-18 ENCOUNTER — Telehealth: Payer: Self-pay | Admitting: Internal Medicine

## 2012-12-18 NOTE — Telephone Encounter (Signed)
Dr Rhea Belton, pt states when you were talking to her after her procedure yesterday, you stated the enemas were about , what she got was 60 mls; she doesn't think she can hold that much all night. Explained to pt, she should hold the enemas as long as she can; is this correct? Other advice? Thanks.

## 2012-12-18 NOTE — Telephone Encounter (Signed)
Informed pt of Dr Lauro Franklin suggestions/instructions for Rowasa enemas; she stated understanding and will call for problems or questions.

## 2012-12-18 NOTE — Telephone Encounter (Signed)
60 ML's is the correct amount. Have her try them and let me know how she's doing after about a week, retain as long as possible. Best to put in as she is laying down and plans to stay down for the night

## 2012-12-25 ENCOUNTER — Encounter: Payer: Self-pay | Admitting: Internal Medicine

## 2013-01-01 ENCOUNTER — Encounter: Payer: Self-pay | Admitting: Internal Medicine

## 2013-01-01 ENCOUNTER — Ambulatory Visit (INDEPENDENT_AMBULATORY_CARE_PROVIDER_SITE_OTHER): Payer: BC Managed Care – PPO | Admitting: Internal Medicine

## 2013-01-01 VITALS — BP 124/60 | HR 120 | Ht 66.0 in | Wt 291.0 lb

## 2013-01-01 DIAGNOSIS — K5130 Ulcerative (chronic) rectosigmoiditis without complications: Secondary | ICD-10-CM

## 2013-01-01 DIAGNOSIS — K51311 Ulcerative (chronic) rectosigmoiditis with rectal bleeding: Secondary | ICD-10-CM

## 2013-01-01 DIAGNOSIS — K5180 Other ulcerative colitis without complications: Secondary | ICD-10-CM

## 2013-01-01 DIAGNOSIS — K51918 Ulcerative colitis, unspecified with other complication: Secondary | ICD-10-CM

## 2013-01-01 MED ORDER — BUDESONIDE 9 MG PO TB24: 9.0000 mg | ORAL_TABLET | Freq: Every day | ORAL | Status: DC

## 2013-01-01 MED ORDER — MESALAMINE 4 G RE ENEM: 4.0000 g | ENEMA | Freq: Every day | RECTAL | Status: DC

## 2013-01-01 MED ORDER — MESALAMINE 1.2 G PO TBEC: 4.8000 g | DELAYED_RELEASE_TABLET | Freq: Every day | ORAL | Status: DC

## 2013-01-01 NOTE — Patient Instructions (Addendum)
We have sent the following medications to your pharmacy for you to pick up at your convenience: Uceris 1 daily for 6 weeks Lialda 4.8 grams (4 tablets) daily. Rowasa   Call in one month to let us know how you are doing                                                We are excited to introduce MyChart, a new best-in-class service that provides you online access to important information in your electronic medical record. We want to make it easier for you to view your health information - all in one secure location - when and where you need it. We expect MyChart will enhance the quality of care and service we provide.  When you register for MyChart, you can:    View your test results.    Request appointments and receive appointment reminders via email.    Request medication renewals.    View your medical history, allergies, medications and immunizations.    Communicate with your physician's office through a password-protected site.    Conveniently print information such as your medication lists.  To find out if MyChart is right for you, please talk to a member of our clinical staff today. We will gladly answer your questions about this free health and wellness tool.  If you are age 12 or older and want a member of your family to have access to your record, you must provide written consent by completing a proxy form available at our office. Please speak to our clinical staff about guidelines regarding accounts for patients younger than age 8.  As you activate your MyChart account and need any technical assistance, please call the MyChart technical support line at [phone]) 83-CHART [phone]) or email your question to [email or web address]. If you email your question(s), please include your name, a return phone number and the best time to reach you.  If you have non-urgent health-related questions, you can send a message to our office through MyChart at [email or web address]. If you  have a medical emergency, call 911.  Thank you for using MyChart as your new health and wellness resource!   MyChart licensed from [employer],  [id number]. Patents Pending.

## 2013-01-01 NOTE — Progress Notes (Signed)
Subjective:    Patient ID: Veronica Conway, female    DOB: 28-Oct-1980, 32 y.o.   MRN: [id number]  HPI Veronica Conway is a 32 yo female with PMH of newly diagnosed ulcerative proctosigmoiditis who returns for followup. She is alone today. She had a colonoscopy on 12/16/2012 which revealed moderate proctosigmoiditis in circumferential fashion to 18 cm from the dentate line but patchy inflammation extending to 25 cm from the dentate line. The remainder of the colon about 25 cm appeared normal. There were a few scattered diverticula in the sigmoid. Biopsies confirmed chronic active colitis consistent with IBD.  After her procedure she was started on Rowasa 4 g at bedtime. She reports having Canasa suppositories at home and she took this for 3 days after her colonoscopy and then switched to Rowasa. She has been on Rowasa for the last 13 days.  Initially she noted some improvement in the frequency of her bowel movements and bleeding, but she does continue to have loose bloody stools with mucus. She is also having tenesmus. She is also having lower abdominal and rectal cramping which is somewhat constant.  On the whole she feels some better but continues to have the above-mentioned symptoms. She estimates she is having most of her bowel movements in the morning 2-3 stools and occasionally more throughout the day. No fevers or chills. She does have some sciatica type pain. She was previously using ibuprofen for this pain but she did stop NSAIDs after her colonoscopy. Appetite is okay. No fevers or chills.   Review of Systems As per history of present illness, otherwise negative  Current Medications, Allergies, Past Medical History, Past Surgical History, Family History and Social History were reviewed in [employer] record.     Objective:   Physical Exam BP 124/60  Pulse 120  Ht 5\' 6"  (1.676 m)  Wt 291 lb (131.997 kg)  BMI 46.99 kg/m2  LMP 12/16/2012 Constitutional: Well-developed and  well-nourished. No distress. HEENT: Normocephalic and atraumatic. Oropharynx is clear and moist. No oropharyngeal exudate. Conjunctivae are normal.  No scleral icterus. Neck: Neck supple. Trachea midline. Cardiovascular: Normal rate, regular rhythm and intact distal pulses.  Pulmonary/chest: Effort normal and breath sounds normal. No wheezing, rales or rhonchi. Abdominal: Soft, obese, no significant lower abdominal tenderness and no rebound or guarding, nondistended. Bowel sounds active throughout.  Extremities: no clubbing, cyanosis, or edema Lymphadenopathy: No cervical adenopathy noted. Neurological: Alert and oriented to person place and time. Skin: Skin is warm and dry. No rashes noted. Psychiatric: Normal mood and affect. Behavior is normal.  CRP 25.4 CBC    Component Value Date/Time   WBC 8.7 12/09/2012 1527   RBC 4.03 12/09/2012 1527   HGB 12.3 12/09/2012 1527   HCT 36.3 12/09/2012 1527   PLT 234.0 12/09/2012 1527   MCV 90.2 12/09/2012 1527   MCHC 34.0 12/09/2012 1527   RDW 14.7* 12/09/2012 1527   LYMPHSABS 2.2 12/09/2012 1527   MONOABS 0.4 12/09/2012 1527   EOSABS 0.2 12/09/2012 1527   BASOSABS 0.0 12/09/2012 1527    CMP     Component Value Date/Time   NA 136 12/09/2012 1528   K 4.3 12/09/2012 1528   CL 105 12/09/2012 1528   CO2 27 12/09/2012 1528   GLUCOSE 88 12/09/2012 1528   BUN 12 12/09/2012 1528   CREATININE 0.9 12/09/2012 1528   CALCIUM 9.5 12/09/2012 1528   PROT 7.9 12/09/2012 1528   ALBUMIN 4.0 12/09/2012 1528   AST 26 12/09/2012 1528  ALT 44* 12/09/2012 1528   ALKPHOS 86 12/09/2012 1528   BILITOT 0.4 12/09/2012 1528      Assessment & Plan:  32 yo female with PMH of newly diagnosed ulcerative proctosigmoiditis who returns for followup.  1.  Ulcerative proctosigmoiditis -- we have discussed her newly diagnosed IBD at length today. She is still having symptoms and signs of active colitis. It seems that Rowasa has helped some, but it is somewhat cumbersome for her to use. She  is willing to continue it for now but feels that it will be hard to use on a chronic basis. She has been able to retain it throughout the night. I would like to add colonic release budesonide 9 mg daily to hopefully help drive her into remission.  I also will add oral mesalamine 4.8 g daily in the form of Lialda.  Hopefully once she has achieved remission we will be able to discontinue the mesalamine enemas and continue her on oral mesalamine only (for overall ease of use and tolerability).  We discussed that this is a chronic condition that will likely relapsing remit. I would like for her to call me in 4-6 weeks to let me know how she is feeling. I will plan to see her back in 3 months. My plan was to see her in about 6 weeks, but due to the expense of her co-pay we will push this to 3 months. I would like to see her should she develop any problems or significant change in symptoms and she has agreed to call me if this occurs. --I have recommended annual flu vaccine and also Pneumovax.  She has plans for her annual physical in September with Dr. Dayton Martes and can receive Pneumovax at this visit.

## 2013-01-06 ENCOUNTER — Telehealth: Payer: Self-pay | Admitting: Internal Medicine

## 2013-01-06 DIAGNOSIS — K51918 Ulcerative colitis, unspecified with other complication: Secondary | ICD-10-CM

## 2013-01-06 MED ORDER — MESALAMINE 4 G RE ENEM: 4.0000 g | ENEMA | Freq: Every day | RECTAL | Status: DC

## 2013-01-06 MED ORDER — DICYCLOMINE HCL 10 MG PO CAPS: 10.0000 mg | ORAL_CAPSULE | Freq: Three times a day (TID) | ORAL | Status: DC | PRN

## 2013-01-06 NOTE — Telephone Encounter (Signed)
Patient is having problems with her pharmacy and wants all future rx to be sent to [facility] in [place].  I have sent refills of her bentyl and rowasa enema.  She has been having cramping taking Uceris and lilada at the same time.  I have asked that she take one in the am and the other several hours later to see if it helps with the cramping.  She will call back with an update in a few weeks per Dr. Lauro Franklin office note to help determine weaning off of Rowasa.

## 2013-01-06 NOTE — Telephone Encounter (Signed)
Left message for patient to call back  

## 2013-01-14 ENCOUNTER — Encounter: Payer: Self-pay | Admitting: Internal Medicine

## 2013-01-15 ENCOUNTER — Ambulatory Visit: Payer: Self-pay | Admitting: Internal Medicine

## 2013-01-15 ENCOUNTER — Telehealth: Payer: Self-pay | Admitting: *Deleted

## 2013-01-15 ENCOUNTER — Other Ambulatory Visit: Payer: Self-pay | Admitting: Internal Medicine

## 2013-01-15 DIAGNOSIS — K529 Noninfective gastroenteritis and colitis, unspecified: Secondary | ICD-10-CM

## 2013-01-15 DIAGNOSIS — M545 Low back pain, unspecified: Secondary | ICD-10-CM

## 2013-01-15 MED ORDER — HYOSCYAMINE SULFATE 0.125 MG SL SUBL: 0.1250 mg | SUBLINGUAL_TABLET | SUBLINGUAL | Status: DC | PRN

## 2013-01-15 MED ORDER — TRAMADOL HCL 50 MG PO TABS: ORAL_TABLET | ORAL | Status: DC

## 2013-01-15 NOTE — Telephone Encounter (Signed)
levsin 0.125 every 4 hours PRN abd cramping Tramadol for pain as rx'ed Await plain films (I will need to be alerted to the results if they are drawn at [facility] they will not be immediately available in EMR) Add 2 view abd film to spine xrays If symptoms persist, next step would be CT abd/pelvis Are her colitis symptoms improving (bloody loose stools, rectal discomfort)? Thanks

## 2013-01-15 NOTE — Telephone Encounter (Signed)
Message copied by Florene Glen on Wed Jan 15, 2013  8:19 AM ------      Message from: Beverley Fiedler      Created: Tue Jan 14, 2013  4:08 PM       See below      Please order SI joint films, r/o sacroiliitis      Also add l-spine xray      Tramadol 50-100 mg q8h prn back pain      JMP                        Hi Veronica Conway      Yes, back pain at times can be related to inflammatory bowel disease, particularly in the sacroiliac part of the spine/pelvis.      Xrays can at times pick this up, and I will order these images      Are your colitis symptoms any better with the Uceris?  Hopefully getting your colitis under control and in remission will also improve your back pain (assuming the 2 are related)      Tramadol may help more with the pain and should not affect your colitis.      You will hear from my nurse      Call with any questions or concerns      Erick Blinks, MD            ===View-only below this line===                  ----- Message -----         FromAdolphus Birchwood         Sent: 01/14/2013  9:38 AM EDT           To: Beverley Fiedler, MD      Subject: Visit Follow-Up Question            At our last visit we had discussed the possibility of having secondary symptoms resulting from the UC. Would it be common for someone with UC to have intense back pain that centers around the spine from the base of the spine radiating up to middle of the back?  I'm just taking ES Tylenol and using a heating pad to ease the pain.  It's especially bad at work and I have to use the heating pad almost continually so that I can focus on work. ------

## 2013-01-15 NOTE — Telephone Encounter (Signed)
Informed pt of XRAYs ordered and after discussion with [facility] radiology, she will go to [facility] for the films; she may check in until 7pm at [facility] and [facility] desk. Ordered Ultram for pt with directions given to her. Pt reports she had a terrible night with stomach cramps like menstrual cramps and back pain and cramps that is continuously going up and down her spine; also nausea. Dr Rhea Belton, any further advice/orders or wait on results? Thanks.

## 2013-01-15 NOTE — Telephone Encounter (Signed)
Pt reports the bloody stools and  Rectal discomfort have improved; nothing in about 5 days. Informed her of add'l med added as well as xray. Pt stated understanding.

## 2013-01-15 NOTE — Telephone Encounter (Signed)
lmom for pt to call back. xrays have been ordered

## 2013-01-16 ENCOUNTER — Telehealth: Payer: Self-pay | Admitting: *Deleted

## 2013-01-16 NOTE — Telephone Encounter (Signed)
Informed pt that we received the xray reports from [facility], but we did not receive a report on 2 View of the abdomen. This was ordered after the spine films and Dr Rhea Belton ordered it d/t her abdominal pain as reported yesterday. Informed her of the Pars Defect, but nothing else suggested sacroiliac inflammation which occurs in IBD. Informed her we can order and MRI or refer her to Ortho, but she states she doesn't want an MRI. She had great results from Lialda and we discussed and decided she will continue the meds and if she has another episode like yesterday,she will call back or communicate via an EMAIL.

## 2013-01-31 ENCOUNTER — Other Ambulatory Visit: Payer: Self-pay | Admitting: Family Medicine

## 2013-01-31 DIAGNOSIS — Z0000 Encounter for general adult medical examination without abnormal findings: Secondary | ICD-10-CM

## 2013-01-31 DIAGNOSIS — Z136 Encounter for screening for cardiovascular disorders: Secondary | ICD-10-CM

## 2013-02-03 ENCOUNTER — Encounter: Payer: Self-pay | Admitting: Family Medicine

## 2013-02-05 ENCOUNTER — Other Ambulatory Visit (INDEPENDENT_AMBULATORY_CARE_PROVIDER_SITE_OTHER): Payer: BC Managed Care – PPO

## 2013-02-05 DIAGNOSIS — Z136 Encounter for screening for cardiovascular disorders: Secondary | ICD-10-CM

## 2013-02-05 DIAGNOSIS — R7989 Other specified abnormal findings of blood chemistry: Secondary | ICD-10-CM

## 2013-02-05 DIAGNOSIS — Z0000 Encounter for general adult medical examination without abnormal findings: Secondary | ICD-10-CM

## 2013-02-05 LAB — COMPREHENSIVE METABOLIC PANEL
ALT: 23 U/L (ref 0–35)
AST: 18 U/L (ref 0–37)
Albumin: 4 g/dL (ref 3.5–5.2)
Calcium: 9.2 mg/dL (ref 8.4–10.5)
Chloride: 103 mEq/L (ref 96–112)
Creatinine, Ser: 0.7 mg/dL (ref 0.4–1.2)
Potassium: 4.2 mEq/L (ref 3.5–5.1)
Sodium: 136 mEq/L (ref 135–145)
Total Protein: 7.6 g/dL (ref 6.0–8.3)

## 2013-02-05 LAB — LIPID PANEL
Cholesterol: 215 mg/dL — ABNORMAL HIGH (ref 0–200)
Total CHOL/HDL Ratio: 6
Triglycerides: 206 mg/dL — ABNORMAL HIGH (ref 0.0–149.0)
VLDL: 41.2 mg/dL — ABNORMAL HIGH (ref 0.0–40.0)

## 2013-02-05 LAB — LDL CHOLESTEROL, DIRECT: Direct LDL: 122.7 mg/dL

## 2013-02-05 LAB — CBC WITH DIFFERENTIAL/PLATELET
Basophils Absolute: 0 10*3/uL (ref 0.0–0.1)
Lymphocytes Relative: 13.9 % (ref 12.0–46.0)
Lymphs Abs: 1.4 10*3/uL (ref 0.7–4.0)
Monocytes Relative: 2.9 % — ABNORMAL LOW (ref 3.0–12.0)
Platelets: 288 10*3/uL (ref 150.0–400.0)
RDW: 14.3 % (ref 11.5–14.6)

## 2013-02-12 ENCOUNTER — Encounter: Payer: Self-pay | Admitting: Family Medicine

## 2013-02-12 ENCOUNTER — Ambulatory Visit (INDEPENDENT_AMBULATORY_CARE_PROVIDER_SITE_OTHER): Payer: BC Managed Care – PPO | Admitting: Family Medicine

## 2013-02-12 ENCOUNTER — Other Ambulatory Visit (HOSPITAL_COMMUNITY)
Admission: RE | Admit: 2013-02-12 | Discharge: 2013-02-12 | Disposition: A | Discharge status: Home / Self Care | Payer: BC Managed Care – PPO | Source: Ambulatory Visit | Attending: Family Medicine | Admitting: Family Medicine

## 2013-02-12 VITALS — BP 122/82 | HR 112 | Temp 98.7°F | Ht 66.0 in | Wt 290.5 lb

## 2013-02-12 DIAGNOSIS — Z0000 Encounter for general adult medical examination without abnormal findings: Secondary | ICD-10-CM

## 2013-02-12 DIAGNOSIS — Z01419 Encounter for gynecological examination (general) (routine) without abnormal findings: Secondary | ICD-10-CM | POA: Insufficient documentation

## 2013-02-12 DIAGNOSIS — Z23 Encounter for immunization: Secondary | ICD-10-CM

## 2013-02-12 DIAGNOSIS — F411 Generalized anxiety disorder: Secondary | ICD-10-CM

## 2013-02-12 DIAGNOSIS — K5130 Ulcerative (chronic) rectosigmoiditis without complications: Secondary | ICD-10-CM

## 2013-02-12 DIAGNOSIS — R7309 Other abnormal glucose: Secondary | ICD-10-CM

## 2013-02-12 DIAGNOSIS — L301 Dyshidrosis [pompholyx]: Secondary | ICD-10-CM

## 2013-02-12 DIAGNOSIS — E669 Obesity, unspecified: Secondary | ICD-10-CM

## 2013-02-12 DIAGNOSIS — Z1151 Encounter for screening for human papillomavirus (HPV): Secondary | ICD-10-CM | POA: Insufficient documentation

## 2013-02-12 DIAGNOSIS — R739 Hyperglycemia, unspecified: Secondary | ICD-10-CM

## 2013-02-12 NOTE — Progress Notes (Signed)
Subjective:    Patient ID: Veronica Conway, female    DOB: 09/27/1980, 32 y.o.   MRN: [id number]  HPI  32 yo G2P2 here for CPX.  Has not had a pap smear in years.  No h/o abnormal pap smears.  No family h/o of breast, uterine or ovarian CA.  Doing well. Going on a trip to [place] [employer] [place].  Diagnosed with IBD (ulcerative proctitis) in 11/2012.  Followed by Dr. Rhea Belton.  Last saw him last month. On Uceris, Lialda, and retention enemas.  Feels she is doing well and finally in remission.  Lab Results  Component Value Date   WBC 10.4 02/05/2013   HGB 12.6 02/05/2013   HCT 36.9 02/05/2013   MCV 88.3 02/05/2013   PLT 288.0 02/05/2013    Has been working on her diet to improve her cholesterol. Wt Readings from Last 3 Encounters:  02/12/13 290 lb 8 oz (131.77 kg)  01/01/13 291 lb (131.997 kg)  12/16/12 293 lb (132.904 kg)    Lab Results  Component Value Date   CHOL 215* 02/05/2013   HDL 38.90* 02/05/2013   LDLDIRECT 122.7 02/05/2013   TRIG 206.0* 02/05/2013   CHOLHDL 6 02/05/2013   Lab Results  Component Value Date   NA 136 02/05/2013   K 4.2 02/05/2013   CL 103 02/05/2013   CO2 27 02/05/2013   Lab Results  Component Value Date   ALT 23 02/05/2013   AST 18 02/05/2013   ALKPHOS 80 02/05/2013   BILITOT 0.4 02/05/2013   Patient Active Problem List   Diagnosis Date Noted  . Routine general medical examination at a health care facility 02/12/2013  . 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 Dyshidrotic eczema 07/29/2012  . Anxiety state, unspecified 07/29/2012  . Obesity, unspecified 07/29/2012   Past Medical History  Diagnosis Date  . GERD (gastroesophageal reflux disease)   . Hay fever   . Vaginal delivery 2003, 2006  . UC (ulcerative colitis)    No past surgical history on file. History  Substance Use Topics  . Smoking status: Never Smoker   . Smokeless tobacco: Never Used  . Alcohol Use: Yes     Comment: occasional   Family History  Problem Relation Age of Onset  . Heart  disease Father   . Mental illness Paternal Grandmother   . Breast cancer Paternal Grandmother   . Alcohol abuse Paternal Grandfather   . Heart disease Paternal Grandfather   . Hypertension Paternal Grandfather    No Known Allergies Current Outpatient Prescriptions on File Prior to Visit  Medication Sig Dispense Refill  . Budesonide (UCERIS) 9 MG TB24 Take 9 mg by mouth daily.  60 tablet  0  . hyoscyamine (LEVSIN SL) 0.125 MG SL tablet Place 1 tablet (0.125 mg total) under the tongue every 4 (four) hours as needed for cramping.  30 tablet  0  . mesalamine (LIALDA) 1.2 G EC tablet Take 4 tablets (4.8 g total) by mouth daily with breakfast.  120 tablet  3  . mesalamine (ROWASA) 4 G enema Place 60 mLs (4 g total) rectally at bedtime.  30 Bottle  2  . traMADol (ULTRAM) 50 MG tablet Take 1-2 tabs by mouth every 8 hours as needed for back pain.  30 tablet  0   No current facility-administered medications on file prior to visit.   The PMH, PSH, Social History, Family History, Medications, and allergies have been reviewed in [facility], and have been updated if relevant.   Review of Systems  See HPI    Objective:   Physical Exam BP 122/82  Pulse 112  Temp(Src) 98.7 F (37.1 C) (Oral)  Ht 5\' 6"  (1.676 m)  Wt 290 lb 8 oz (131.77 kg)  BMI 46.91 kg/m2  SpO2 97%  LMP 01/15/2013   General:  obese,well-nourished,in no acute distress; alert,appropriate and cooperative throughout examination Head:  normocephalic and atraumatic.   Eyes:  vision grossly intact, pupils equal, pupils round, and pupils reactive to light.   Ears:  R ear normal and L ear normal.   Nose:  no external deformity.   Mouth:  good dentition.   Neck:  No deformities, masses, or tenderness noted. Breasts:  No mass, nodules, thickening, tenderness, bulging, retraction, inflamation, nipple discharge or skin changes noted.   Lungs:  Normal respiratory effort, chest expands symmetrically. Lungs are clear to auscultation, no crackles  or wheezes. Heart:  Normal rate and regular rhythm. S1 and S2 normal without gallop, murmur, click, rub or other extra sounds. Abdomen:  Bowel sounds positive,abdomen soft and non-tender without masses, organomegaly or hernias noted. Rectal:  no external abnormalities.   Genitalia:  Pelvic Exam:        External: normal female genitalia without lesions or masses        Vagina: normal without lesions or masses        Cervix: normal without lesions or masses        Adnexa: normal bimanual exam without masses or fullness        Uterus: normal by palpation        Pap smear: performed Msk:  No deformity or scoliosis noted of thoracic or lumbar spine.   Extremities:  No clubbing, cyanosis, edema, or deformity noted with normal full range of motion of all joints.   Neurologic:  alert & oriented X3 and gait normal.   Skin:  Intact without suspicious lesions or rashes Cervical Nodes:  No lymphadenopathy noted Axillary Nodes:  No palpable lymphadenopathy Psych:  Cognition and judgment appear intact. Alert and cooperative with normal attention span and concentration. No apparent delusions, illusions, hallucinations      Assessment & Plan:  1. Ulcerative proctosigmoiditis Appears to be close to remission. Following up with Dr. Rhea Belton. Pneumovax today.  2. Obesity, unspecified She is working on diet. Encouraged her to continue.    3. Anxiety state, unspecified Stable.  4. Routine general medical examination at a health care facility  Reviewed preventive care protocols, scheduled due services, and updated immunizations Discussed nutrition, exercise, diet, and healthy lifestyle. Pap Pneumovax Flu shot  5. Hyperglycemia Likely due to steroid use. Continue to monitor but no concern currently.  6. Encounter for routine gynecological examination  - Cytology - PAP  7. Need for prophylactic vaccination and inoculation against influenza  - Flu Vaccine QUAD 36+ mos PF IM (Fluarix)  8.  Need for pneumococcal vaccination  - Pneumococcal polysaccharide vaccine 23-valent greater than or equal to 2yo subcutaneous/IM

## 2013-02-17 ENCOUNTER — Encounter: Payer: Self-pay | Admitting: Family Medicine

## 2013-03-13 ENCOUNTER — Other Ambulatory Visit: Payer: Self-pay | Admitting: Gastroenterology

## 2013-03-13 DIAGNOSIS — K51918 Ulcerative colitis, unspecified with other complication: Secondary | ICD-10-CM

## 2013-03-13 MED ORDER — BUDESONIDE 9 MG PO TB24: 9.0000 mg | ORAL_TABLET | Freq: Every day | ORAL | Status: DC

## 2013-06-02 ENCOUNTER — Other Ambulatory Visit: Payer: Self-pay | Admitting: Internal Medicine

## 2013-07-18 ENCOUNTER — Other Ambulatory Visit: Payer: Self-pay | Admitting: Internal Medicine

## 2013-10-13 ENCOUNTER — Encounter: Payer: Self-pay | Admitting: Family Medicine

## 2013-12-08 ENCOUNTER — Other Ambulatory Visit: Payer: Self-pay | Admitting: Internal Medicine

## 2014-01-25 ENCOUNTER — Other Ambulatory Visit: Payer: Self-pay | Admitting: Internal Medicine

## 2014-01-25 ENCOUNTER — Other Ambulatory Visit: Payer: Self-pay | Admitting: Gastroenterology

## 2014-03-10 ENCOUNTER — Telehealth: Payer: Self-pay | Admitting: Internal Medicine

## 2014-03-10 NOTE — Telephone Encounter (Signed)
Pt states she just found out she is pregnant and she is about 6 weeks. Pt wants to know if she needs to stop her Lialda and if there is something else she should take. Please advise.

## 2014-03-10 NOTE — Telephone Encounter (Signed)
Spoke with pt and she is aware, states she will call back to schedule an OV once her new insurance is straightened out.

## 2014-03-10 NOTE — Telephone Encounter (Signed)
Lialda is okay during pregnancy, IBD flare during pregnancy can occur and we want to try to prevent this Office followup is recommended, last seen 14 months ago

## 2014-03-10 NOTE — Telephone Encounter (Signed)
Left message for pt to call back  °

## 2014-03-23 ENCOUNTER — Telehealth: Payer: Self-pay

## 2014-03-23 DIAGNOSIS — Z3490 Encounter for supervision of normal pregnancy, unspecified, unspecified trimester: Secondary | ICD-10-CM

## 2014-03-23 NOTE — Telephone Encounter (Signed)
Referral placed.

## 2014-03-23 NOTE — Telephone Encounter (Signed)
Pt left v/m; pt is pregnant and was not happy with last OB GYN. Pt request referral to OB GYN. Pt request cb.

## 2014-04-17 LAB — HEMOGLOBIN A1C: Hemoglobin A1C: 5.5

## 2014-05-08 ENCOUNTER — Other Ambulatory Visit: Payer: Self-pay | Admitting: Internal Medicine

## 2014-07-17 LAB — HEMOGLOBIN A1C: Hemoglobin A1C: 11

## 2014-08-03 ENCOUNTER — Telehealth: Payer: Self-pay | Admitting: Internal Medicine

## 2014-08-03 NOTE — Telephone Encounter (Signed)
Stress can elevated blood sugar, but she is also at risk for gestational diabetes, which is a diagnosis that has to be deferred to her OB MD Diet would be dependent on activity of colitis and if she is having signs/symptoms of active flare then low residue may be beneficial If glucoses are elevated then a low carb diet is preferred, but as with any pregnant patient this must be discussed with her OB MD

## 2014-08-03 NOTE — Telephone Encounter (Signed)
Spoke with pt and she is aware.

## 2014-08-03 NOTE — Telephone Encounter (Signed)
Pt is 29 1/[redacted] weeks pregnant and did not pass her 3 hour glucose tolerance test. Pt states she has had some mild flares and wondered if that could make her blood sugar be elevated. Discussed with pt that any stress on the body can elevated blood sugars. Pt wants to know if Dr. Hilarie Fredrickson has any suggestions as to what type diet she should be following. States that when she met with dietitians they were not much help. Please advise.

## 2014-10-20 LAB — HEMOGLOBIN A1C: HEMOGLOBIN A1C: 10.6

## 2014-10-20 LAB — CBC AND DIFFERENTIAL: Hemoglobin: 10.6 — AB (ref 12.0–16.0)

## 2014-11-28 IMAGING — CR BILATERAL SACROILIAC JOINTS - 3+ VIEW
1 series · 3 of 3 positions shown · non-contrast
Comparison: none

REASON FOR EXAM: IBD,  Abd pain, sacrolitis
COMMENTS:

PROCEDURE:     DXR - DXR SACROILIAC JOINTS  - January 15, 2013  [DATE]
RESULT:     Phleboliths are present in the pelvic region. The sacroiliac
joints appear to be unremarkable. The sacral arches appear to be intact.

[Series 1: t sacrum ap · 0.14mm/px · 3 of 3 slices shown]
[im 1/3]
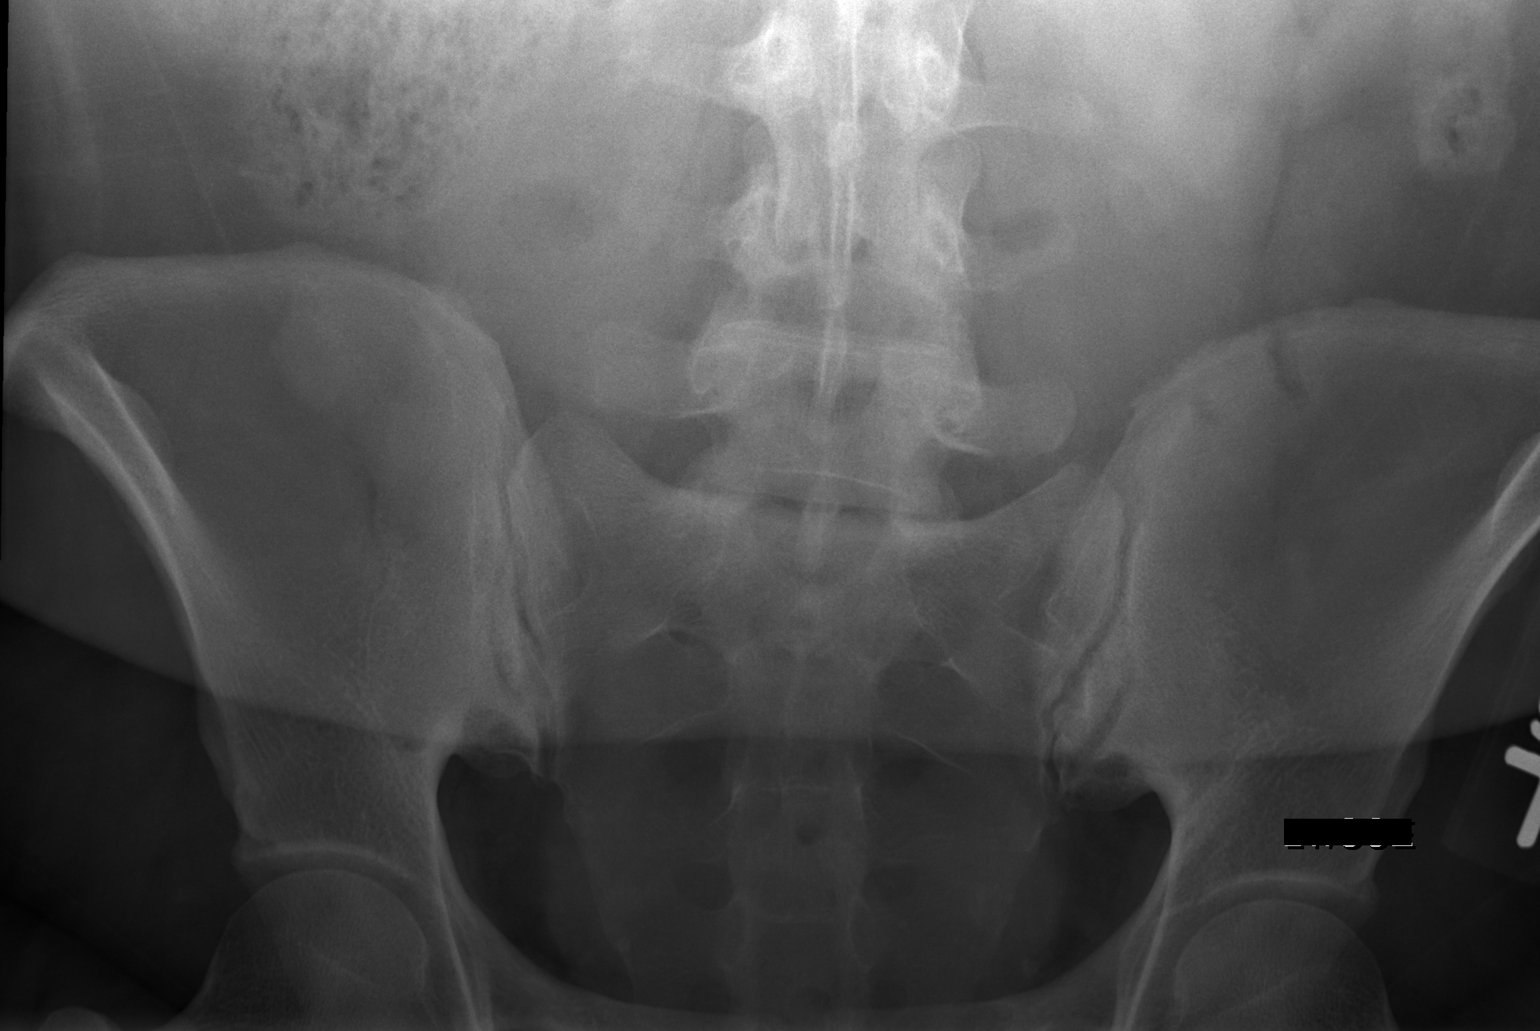
[im 2/3]
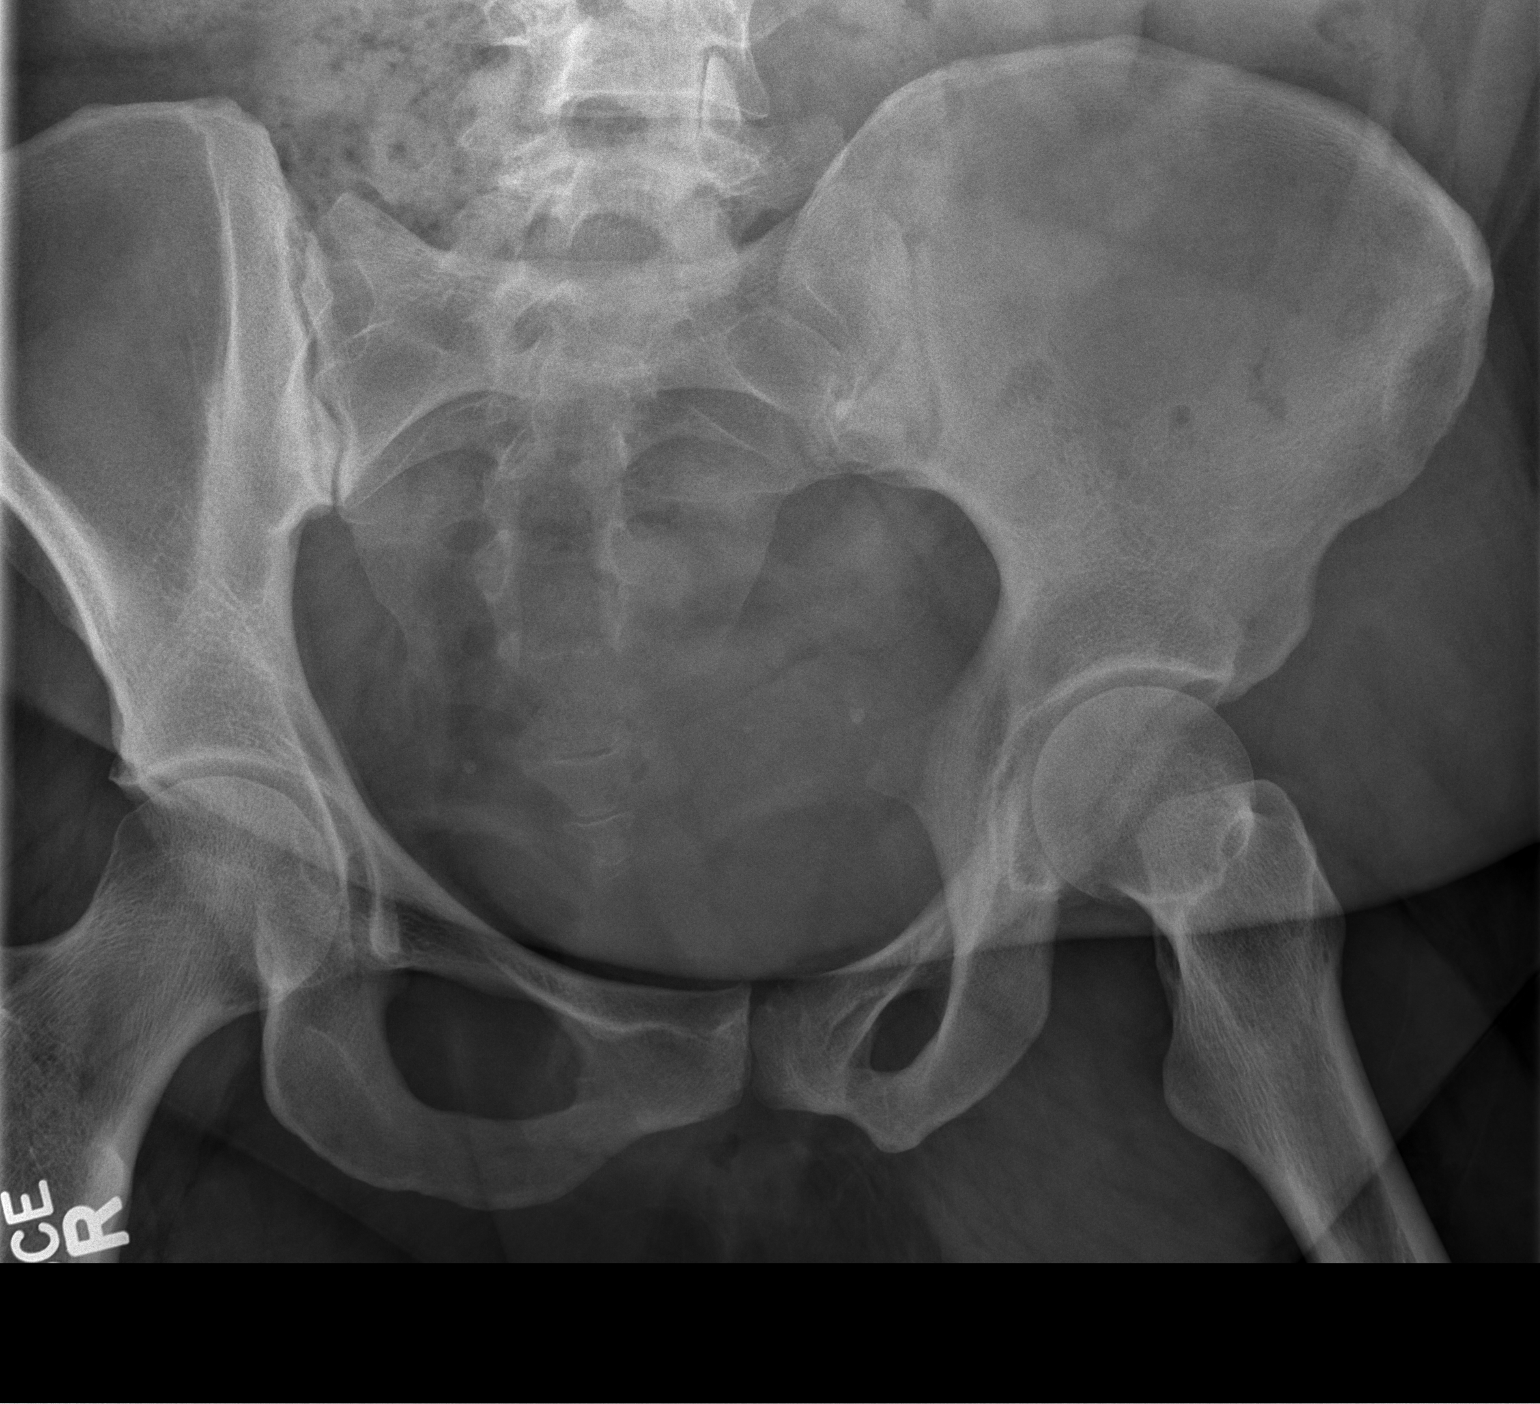
[im 3/3]
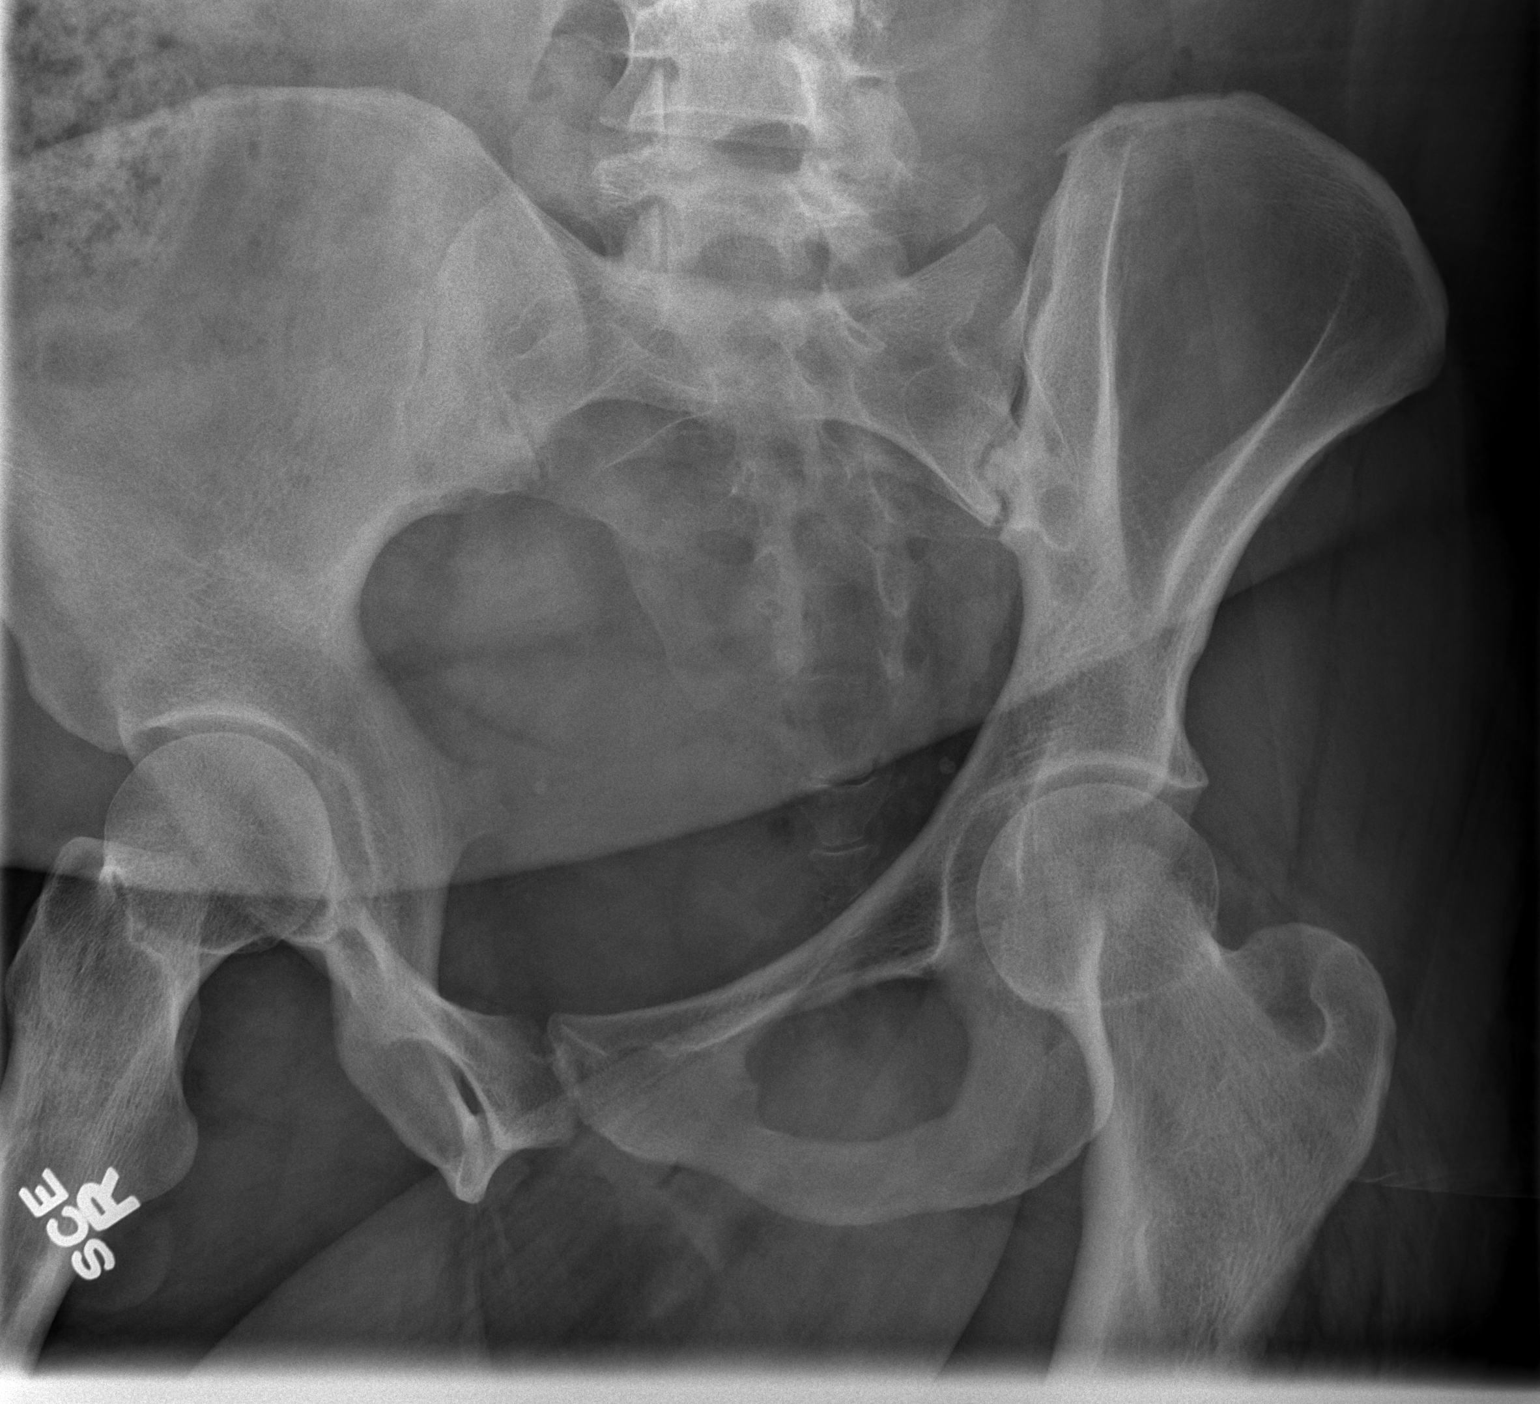

[3 of 3 positions shown; findings below may reference images not displayed]

IMPRESSION: No acute bony abnormality the sacroiliac joints.

[REDACTED]

## 2014-11-28 IMAGING — CR DG LUMBAR SPINE 2-3V
1 series · 3 of 3 positions shown · non-contrast
Comparison: none

REASON FOR EXAM: IBD,  Abd pain, sacrolitis
COMMENTS:

[Series 1: t lumbar spine ap · 0.14mm/px · 3 of 3 slices shown]
[im 1/3]
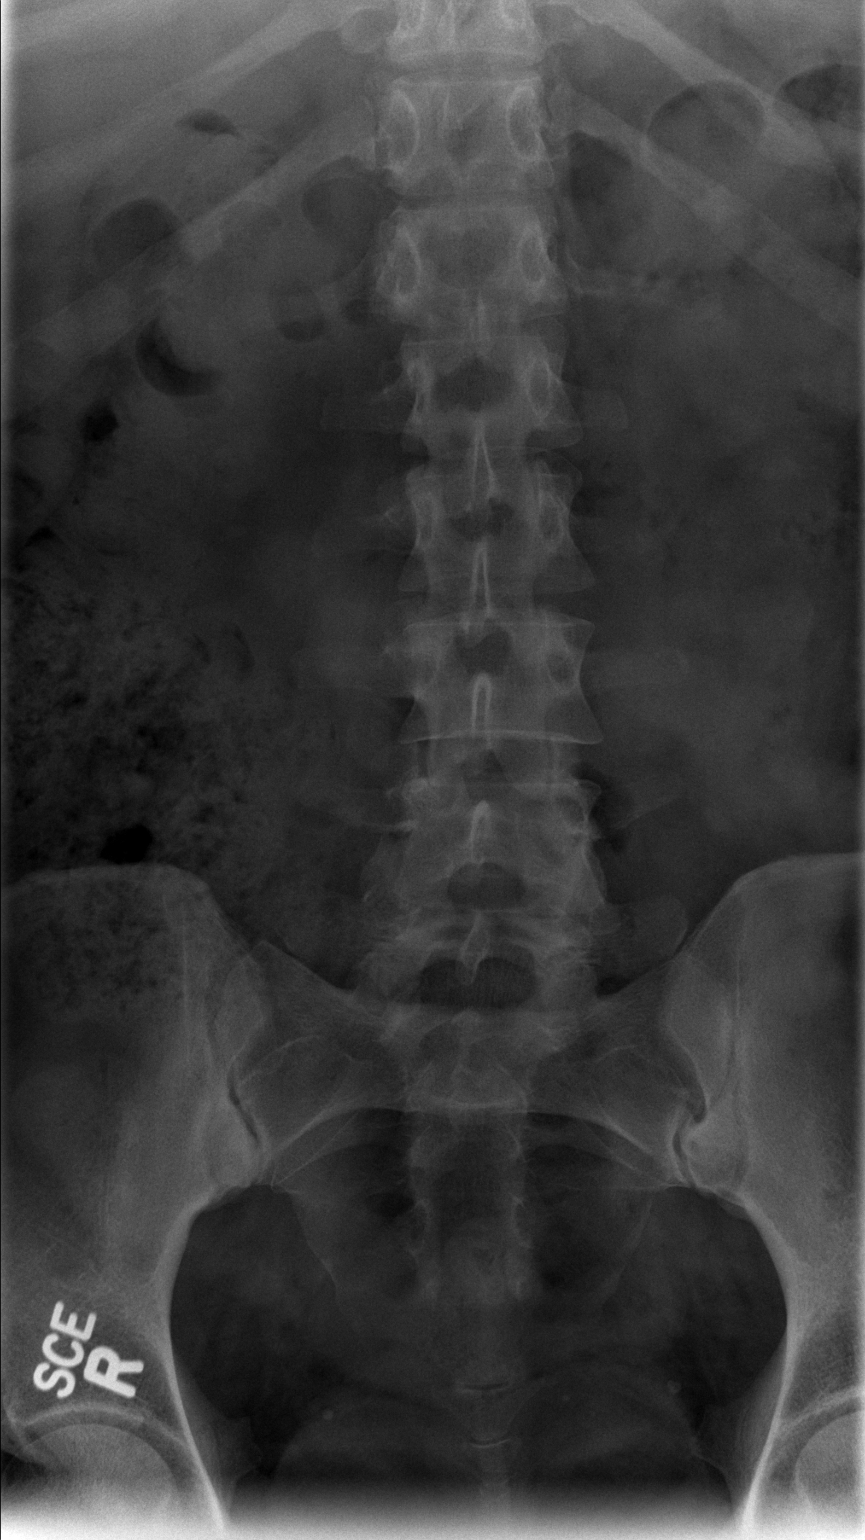
[im 2/3]
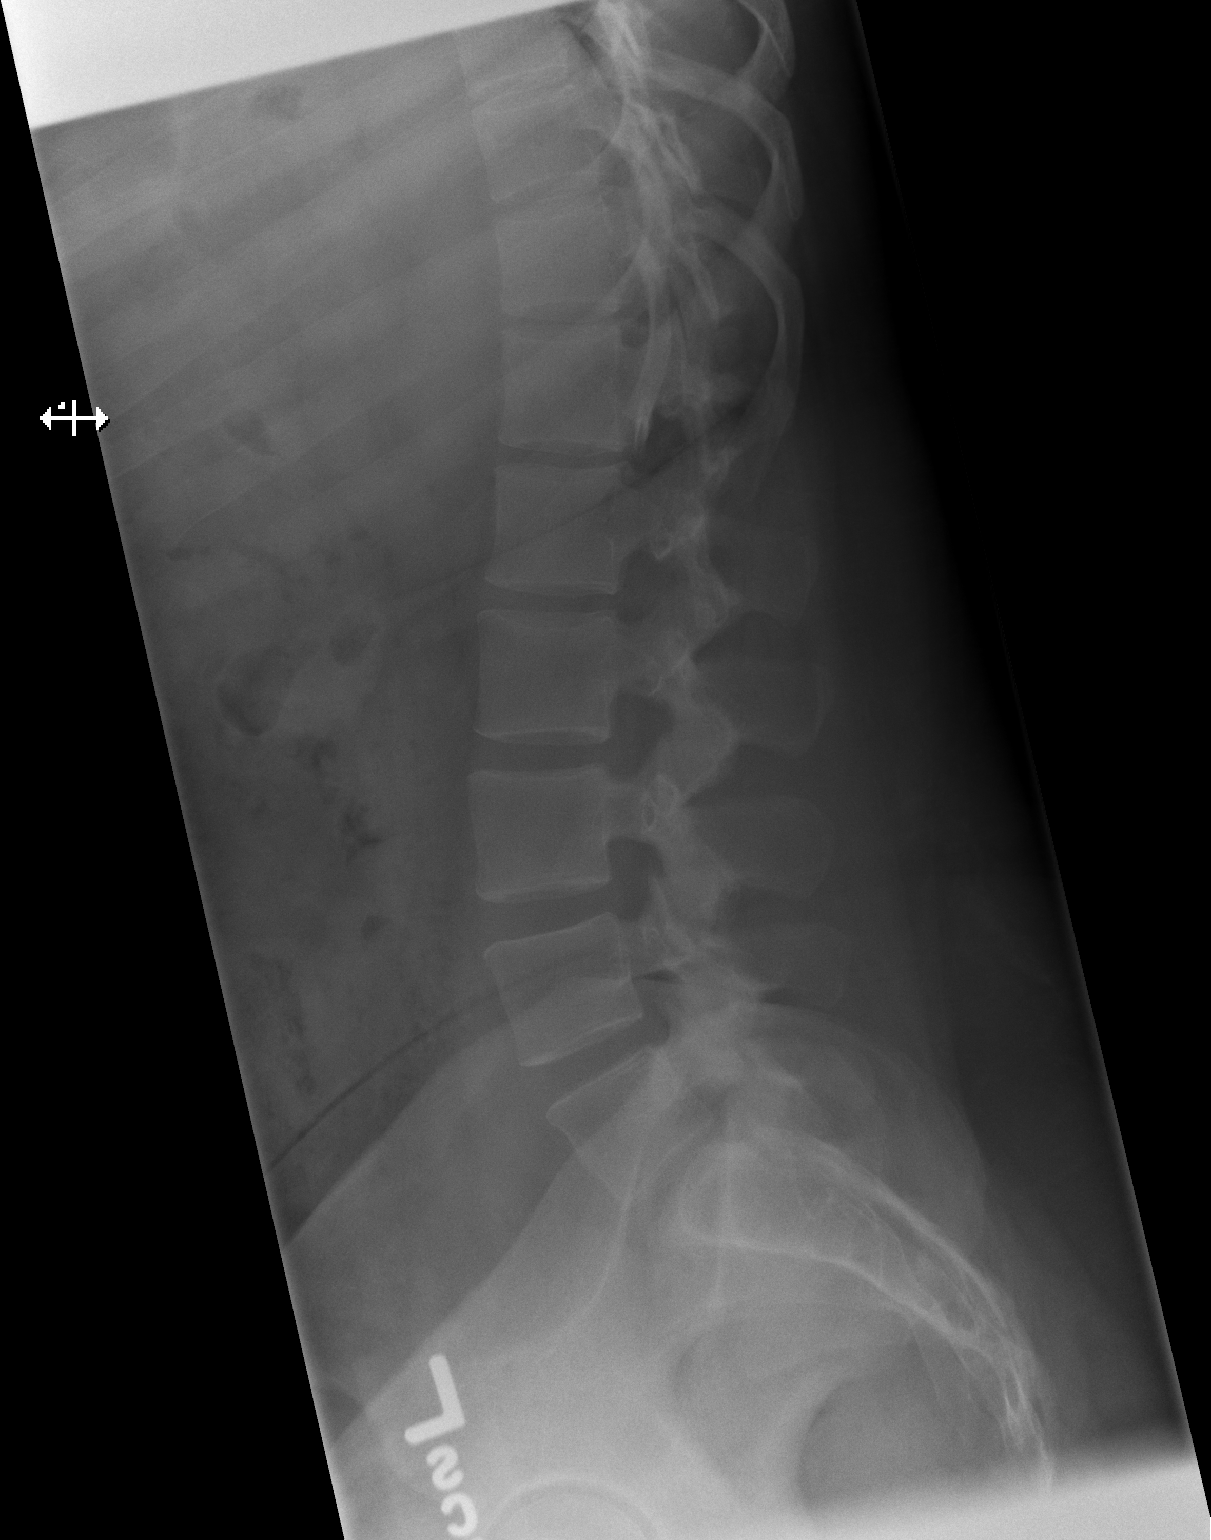
[im 3/3]
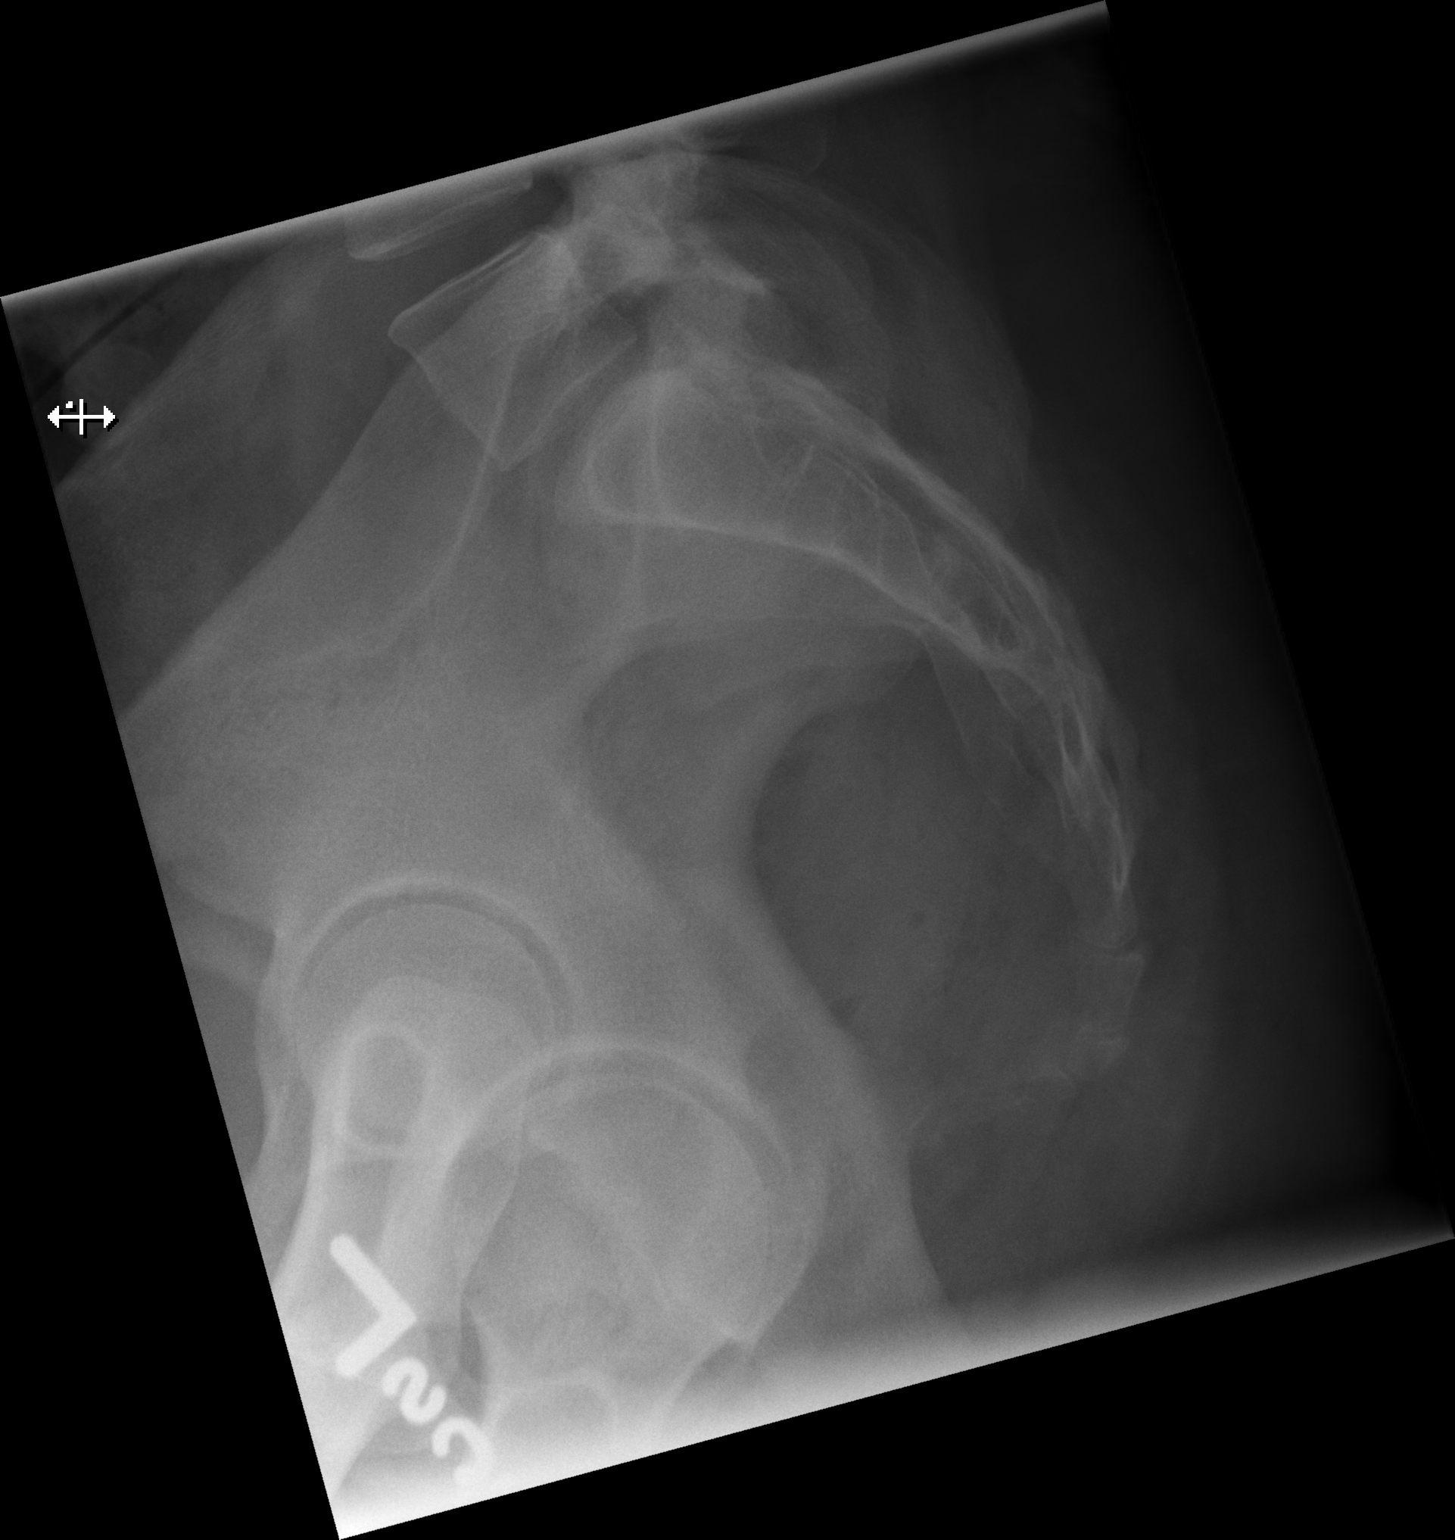

[3 of 3 positions shown; findings below may reference images not displayed]

PROCEDURE:     DXR - DXR LUMBAR SPINE AP AND LATERAL  - January 15, 2013  [DATE]

RESULT:     There is lucency in the pars interarticularis area at L5
suggestive of pars defects. Oblique views were not requested. Spinal
alignment is normal. The disc spaces and vertebral body heights are
maintained. There is no bony destruction.
IMPRESSION: Findings suggestive of pars defects at L5-S1.

[REDACTED]

## 2015-07-19 ENCOUNTER — Encounter: Payer: Self-pay | Admitting: Primary Care

## 2015-07-19 ENCOUNTER — Ambulatory Visit (INDEPENDENT_AMBULATORY_CARE_PROVIDER_SITE_OTHER): Payer: Self-pay | Admitting: Primary Care

## 2015-07-19 VITALS — BP 124/88 | HR 94 | Temp 98.3°F | Ht 66.0 in | Wt 288.8 lb

## 2015-07-19 DIAGNOSIS — J029 Acute pharyngitis, unspecified: Secondary | ICD-10-CM

## 2015-07-19 LAB — POCT RAPID STREP A (OFFICE): Rapid Strep A Screen: NEGATIVE

## 2015-07-19 NOTE — Progress Notes (Signed)
Subjective:    Patient ID: Veronica Conway, female    DOB: [birth year], 35 y.o.   MRN: [id number]  HPI  Veronica Conway is a 35 year old female who presents today with a chief complaint of sore throat. She also reports cough, low grade fevers, ear fullness, ear pain. Her symptoms began Wednesday last week. Her cough began 2 days ago. Her cough is productive with green sputum. She's taken Tylenol, Dayquil, herbal teas, sudafed, cough drops with temporary improvement. Her most bothersome symptom is her sore throat and right ear pain. She is also currently breastfeeding.  Review of Systems  Constitutional: Positive for fever, chills and fatigue.  HENT: Positive for congestion, ear pain and sore throat. Negative for sinus pressure.   Respiratory: Positive for cough. Negative for shortness of breath.   Cardiovascular: Negative for chest pain.       Past Medical History  Diagnosis Date  . GERD (gastroesophageal reflux disease)   . Hay fever   . Vaginal delivery 2003, 2006  . UC (ulcerative colitis) [facility])     Social History   Social History  . Marital Status: Married    Spouse Name: N/A  . Number of Children: N/A  . Years of Education: N/A   Occupational History  . program coordinater    Social History Main Topics  . Smoking status: Never Smoker   . Smokeless tobacco: Never Used  . Alcohol Use: Yes     Comment: occasional  . Drug Use: No  . Sexual Activity: Not on file   Other Topics Concern  . Not on file   Social History Narrative    No past surgical history on file.  Family History  Problem Relation Age of Onset  . Heart disease Father   . Mental illness Paternal Grandmother   . Breast cancer Paternal Grandmother   . Alcohol abuse Paternal Grandfather   . Heart disease Paternal Grandfather   . Hypertension Paternal Grandfather     No Known Allergies  Current Outpatient Prescriptions on File Prior to Visit  Medication Sig Dispense Refill  . LIALDA 1.2 G EC tablet  TAKE 4 TABLETS BY MOUTH EVERY DAY WITH BREAKFAST 120 tablet 0  . mesalamine (ROWASA) 4 G enema Place 60 mLs (4 g total) rectally at bedtime. (Patient not taking: Reported on 07/19/2015) 30 Bottle 2   No current facility-administered medications on file prior to visit.    BP 124/88 mmHg  Pulse 94  Temp(Src) 98.3 F (36.8 C) (Oral)  Ht  (1.676 m)  Wt 288 lb 12.8 oz (130.999 kg)  BMI 46.64 kg/m2  SpO2 96%    Objective:   Physical Exam  Constitutional: She appears well-nourished.  HENT:  Right Ear: Ear canal normal. Tympanic membrane is bulging. Tympanic membrane is not retracted.  Left Ear: Ear canal normal. Tympanic membrane is not retracted.  Nose: Right sinus exhibits no maxillary sinus tenderness and no frontal sinus tenderness. Left sinus exhibits no maxillary sinus tenderness and no frontal sinus tenderness.  Mouth/Throat: Oropharynx is clear and moist.  Neck: Neck supple.  Cardiovascular: Normal rate and regular rhythm.   Pulmonary/Chest: Effort normal and breath sounds normal. She has no wheezes. She has no rales.  Lymphadenopathy:    She has no cervical adenopathy.  Skin: Skin is warm and dry.          Assessment & Plan:  URI:  Ear fullness/pain, sore throat, low grade fever since Wednesday last week. Cough x 2  days, productive. Exam with clear lungs and mostly normal HENT. Does not appear ill.  Suspect viral URI at this point and will treat with supportive measures. Flonase nasal spray, Robitussin, daily Claritin. Fluids, rest, return precautions provided.

## 2015-07-19 NOTE — Patient Instructions (Addendum)
Your strep test was negative.  Nasal Congestion: Try using Flonase (fluticasone) nasal spray. Instill 2 sprays in each nostril once daily. This will also help with ear fullness.  You may take Claritin daily for the next 2 weeks for ear fullness and drainage.   You may take Robitussin or Dayquil as needed for cough.  Increase consumption of fluids and rest.  Please call me if no improvement Friday this week.  It was a pleasure meeting you!  Upper Respiratory Infection, Adult Most upper respiratory infections (URIs) are a viral infection of the air passages leading to the lungs. A URI affects the nose, throat, and upper air passages. The most common type of URI is nasopharyngitis and is typically referred to as "the common cold." URIs run their course and usually go away on their own. Most of the time, a URI does not require medical attention, but sometimes a bacterial infection in the upper airways can follow a viral infection. This is called a secondary infection. Sinus and middle ear infections are common types of secondary upper respiratory infections. Bacterial pneumonia can also complicate a URI. A URI can worsen asthma and chronic obstructive pulmonary disease (COPD). Sometimes, these complications can require emergency medical care and may be life threatening.  CAUSES Almost all URIs are caused by viruses. A virus is a type of germ and can spread from one person to another.  RISKS FACTORS You may be at risk for a URI if:   You smoke.   You have chronic heart or lung disease.  You have a weakened defense (immune) system.   You are very young or very old.   You have nasal allergies or asthma.  You work in crowded or poorly ventilated areas.  You work in health care facilities or schools. SIGNS AND SYMPTOMS  Symptoms typically develop 2-3 days after you come in contact with a cold virus. Most viral URIs last 7-10 days. However, viral URIs from the influenza virus (flu  virus) can last 14-18 days and are typically more severe. Symptoms may include:   Runny or stuffy (congested) nose.   Sneezing.   Cough.   Sore throat.   Headache.   Fatigue.   Fever.   Loss of appetite.   Pain in your forehead, behind your eyes, and over your cheekbones (sinus pain).  Muscle aches.  DIAGNOSIS  Your health care provider may diagnose a URI by:  Physical exam.  Tests to check that your symptoms are not due to another condition such as:  Strep throat.  Sinusitis.  Pneumonia.  Asthma. TREATMENT  A URI goes away on its own with time. It cannot be cured with medicines, but medicines may be prescribed or recommended to relieve symptoms. Medicines may help:  Reduce your fever.  Reduce your cough.  Relieve nasal congestion. HOME CARE INSTRUCTIONS   Take medicines only as directed by your health care provider.   Gargle warm saltwater or take cough drops to comfort your throat as directed by your health care provider.  Use a warm mist humidifier or inhale steam from a shower to increase air moisture. This may make it easier to breathe.  Drink enough fluid to keep your urine clear or pale yellow.   Eat soups and other clear broths and maintain good nutrition.   Rest as needed.   Return to work when your temperature has returned to normal or as your health care provider advises. You may need to stay home longer to avoid infecting  others. You can also use a face mask and careful hand washing to prevent spread of the virus.  Increase the usage of your inhaler if you have asthma.   Do not use any tobacco products, including cigarettes, chewing tobacco, or electronic cigarettes. If you need help quitting, ask your health care provider. PREVENTION  The best way to protect yourself from getting a cold is to practice good hygiene.   Avoid oral or hand contact with people with cold symptoms.   Wash your hands often if contact occurs.   There is no clear evidence that vitamin C, vitamin E, echinacea, or exercise reduces the chance of developing a cold. However, it is always recommended to get plenty of rest, exercise, and practice good nutrition.  SEEK MEDICAL CARE IF:   You are getting worse rather than better.   Your symptoms are not controlled by medicine.   You have chills.  You have worsening shortness of breath.  You have brown or red mucus.  You have yellow or brown nasal discharge.  You have pain in your face, especially when you bend forward.  You have a fever.  You have swollen neck glands.  You have pain while swallowing.  You have white areas in the back of your throat. SEEK IMMEDIATE MEDICAL CARE IF:   You have severe or persistent:  Headache.  Ear pain.  Sinus pain.  Chest pain.  You have chronic lung disease and any of the following:  Wheezing.  Prolonged cough.  Coughing up blood.  A change in your usual mucus.  You have a stiff neck.  You have changes in your:  Vision.  Hearing.  Thinking.  Mood. MAKE SURE YOU:   Understand these instructions.  Will watch your condition.  Will get help right away if you are not doing well or get worse.   This information is not intended to replace advice given to you by your health care provider. Make sure you discuss any questions you have with your health care provider.   Document Released: 11/08/2000 Document Revised: 09/29/2014 Document Reviewed: 08/20/2013 Elsevier Interactive Patient Education [employer]

## 2015-07-19 NOTE — Progress Notes (Signed)
Pre visit review using our clinic review tool, if applicable. No additional management support is needed unless otherwise documented below in the visit note. 

## 2015-07-23 ENCOUNTER — Encounter: Payer: Self-pay | Admitting: Primary Care

## 2015-07-23 ENCOUNTER — Other Ambulatory Visit: Payer: Self-pay | Admitting: Primary Care

## 2015-07-23 DIAGNOSIS — R059 Cough, unspecified: Secondary | ICD-10-CM

## 2015-07-23 DIAGNOSIS — R05 Cough: Secondary | ICD-10-CM

## 2015-07-23 MED ORDER — AZITHROMYCIN 250 MG PO TABS: ORAL_TABLET | ORAL | Status: DC

## 2016-12-13 ENCOUNTER — Ambulatory Visit (INDEPENDENT_AMBULATORY_CARE_PROVIDER_SITE_OTHER): Payer: BLUE CROSS/BLUE SHIELD | Admitting: Family Medicine

## 2016-12-13 ENCOUNTER — Encounter: Payer: Self-pay | Admitting: Family Medicine

## 2016-12-13 VITALS — BP 122/80 | HR 97 | Ht 66.0 in | Wt 307.0 lb

## 2016-12-13 DIAGNOSIS — H6983 Other specified disorders of Eustachian tube, bilateral: Secondary | ICD-10-CM | POA: Diagnosis not present

## 2016-12-13 DIAGNOSIS — H6993 Unspecified Eustachian tube disorder, bilateral: Secondary | ICD-10-CM

## 2016-12-13 DIAGNOSIS — M545 Low back pain, unspecified: Secondary | ICD-10-CM

## 2016-12-13 DIAGNOSIS — Z0000 Encounter for general adult medical examination without abnormal findings: Secondary | ICD-10-CM

## 2016-12-13 DIAGNOSIS — K51319 Ulcerative (chronic) rectosigmoiditis with unspecified complications: Secondary | ICD-10-CM

## 2016-12-13 DIAGNOSIS — E669 Obesity, unspecified: Secondary | ICD-10-CM

## 2016-12-13 DIAGNOSIS — Z0001 Encounter for general adult medical examination with abnormal findings: Secondary | ICD-10-CM | POA: Diagnosis not present

## 2016-12-13 DIAGNOSIS — IMO0001 Reserved for inherently not codable concepts without codable children: Secondary | ICD-10-CM

## 2016-12-13 LAB — COMPREHENSIVE METABOLIC PANEL
ALBUMIN: 4.1 g/dL (ref 3.5–5.2)
ALT: 24 U/L (ref 0–35)
AST: 20 U/L (ref 0–37)
Alkaline Phosphatase: 88 U/L (ref 39–117)
BILIRUBIN TOTAL: 0.3 mg/dL (ref 0.2–1.2)
BUN: 12 mg/dL (ref 6–23)
CALCIUM: 9.4 mg/dL (ref 8.4–10.5)
CO2: 27 mEq/L (ref 19–32)
CREATININE: 0.75 mg/dL (ref 0.40–1.20)
Chloride: 102 mEq/L (ref 96–112)
GFR: 92.9 mL/min (ref >60.00)
Glucose, Bld: 87 mg/dL (ref 70–99)
Potassium: 3.8 mEq/L (ref 3.5–5.1)
Sodium: 135 mEq/L (ref 135–145)
TOTAL PROTEIN: 7.2 g/dL (ref 6.0–8.3)

## 2016-12-13 LAB — CBC WITH DIFFERENTIAL/PLATELET
Basophils Absolute: 0 10*3/uL (ref 0.0–0.1)
Basophils Relative: 0.4 % (ref 0.0–3.0)
Eosinophils Absolute: 0.2 10*3/uL (ref 0.0–0.7)
Eosinophils Relative: 2.3 % (ref 0.0–5.0)
HCT: 32.5 % — ABNORMAL LOW (ref 36.0–46.0)
Hemoglobin: 10.3 g/dL — ABNORMAL LOW (ref 12.0–15.0)
Lymphocytes Relative: 24.2 % (ref 12.0–46.0)
Lymphs Abs: 1.9 10*3/uL (ref 0.7–4.0)
MCHC: 31.7 g/dL (ref 30.0–36.0)
MCV: 78.6 fl (ref 78.0–100.0)
MONOS PCT: 5.1 % (ref 3.0–12.0)
Monocytes Absolute: 0.4 10*3/uL (ref 0.1–1.0)
NEUTROS PCT: 68 % (ref 43.0–77.0)
Neutro Abs: 5.4 10*3/uL (ref 1.4–7.7)
PLATELETS: 289 10*3/uL (ref 150.0–400.0)
RBC: 4.13 Mil/uL (ref 3.87–5.11)
RDW: 16.6 % — AB (ref 11.5–15.5)
WBC: 7.9 10*3/uL (ref 4.0–10.5)

## 2016-12-13 LAB — LIPID PANEL
CHOLESTEROL: 173 mg/dL (ref 0–200)
HDL: 34.2 mg/dL — ABNORMAL LOW (ref >39.00)
NONHDL: 139.02
TRIGLYCERIDES: 255 mg/dL — AB (ref 0.0–149.0)
Total CHOL/HDL Ratio: 5
VLDL: 51 mg/dL — ABNORMAL HIGH (ref 0.0–40.0)

## 2016-12-13 LAB — LDL CHOLESTEROL, DIRECT: Direct LDL: 93 mg/dL

## 2016-12-13 LAB — TSH: TSH: 2.75 u[IU]/mL (ref 0.35–4.50)

## 2016-12-13 NOTE — Patient Instructions (Signed)
Great to see you.   We will call you with your lab results and you can review them online.  Try flonase for your ear.  Alleve is okay to use for sciatica.

## 2016-12-13 NOTE — Assessment & Plan Note (Signed)
Intermittent- discussed losing weight as important step in preventing these flares.  Increased exercise as well. For acute flares- discussed short course of NSAIDs, heat. Call or return to clinic prn if these symptoms worsen or fail to improve as anticipated. The patient indicates understanding of these issues and agrees with the plan.

## 2016-12-13 NOTE — Progress Notes (Signed)
Subjective:   Patient ID: Veronica Conway, female    DOB: [birth year], 36 y.o.   MRN: [id number]  Veronica Conway is a pleasant 36 y.o. year old female who presents to clinic today with Annual Exam  on 12/13/2016  HPI:  I have not seen patient in nearly 4 years.   ETD- feels ears are constantly full and popping.  Has not tried any rx for this.  ? Sciatica- intermittent one sided low back pain (alternates sides) with radiculopathy. Denies LE weakness.   Last pap smear was done by OBGYN. Has copper IUD.  Diagnosed with IBD (ulcerative proctitis) in 11/2012.On Uceris, Lialda, and retention enemas.   Feels she is doing well and  in remission. Lab Results  Component Value Date   CHOL 215 (H) 02/05/2013   HDL 38.90 (L) 02/05/2013   LDLDIRECT 122.7 02/05/2013   TRIG 206.0 (H) 02/05/2013   CHOLHDL 6 02/05/2013   Lab Results  Component Value Date   NA 136 02/05/2013   K 4.2 02/05/2013   CL 103 02/05/2013   CO2 27 02/05/2013   Lab Results  Component Value Date   WBC 10.4 02/05/2013   HGB 12.6 02/05/2013   HCT 36.9 02/05/2013   MCV 88.3 02/05/2013   PLT 288.0 02/05/2013   No results found for: TSH   No current outpatient prescriptions on file prior to visit.   No current facility-administered medications on file prior to visit.     No Known Allergies  Past Medical History:  Diagnosis Date  . GERD (gastroesophageal reflux disease)   . Hay fever   . UC (ulcerative colitis) (HCC)   . Vaginal delivery 2003, 2006    No past surgical history on file.  Family History  Problem Relation Age of Onset  . Heart disease Father   . Mental illness Paternal Grandmother   . Breast cancer Paternal Grandmother   . Alcohol abuse Paternal Grandfather   . Heart disease Paternal Grandfather   . Hypertension Paternal Grandfather     Social History   Social History  . Marital status: Married    Spouse name: N/A  . Number of children: N/A  . 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   Social History Main Topics  . Smoking status: Never Smoker  . Smokeless tobacco: Never Used  . Alcohol use Yes     Comment: occasional  . Drug use: No  . Sexual activity: Not on file   Other Topics Concern  . Not on file   Social History Narrative  . No narrative on file   The PMH, PSH, Social History, Family History, Medications, and [facility], and have been updated if relevant.   Review of Systems  Constitutional: Positive for unexpected weight change.       Feels she has gained weight at a rapid rate since her pregnancy with her 36 year old  HENT: Positive for ear pain and hearing loss.   Eyes: Negative.   Respiratory: Negative.   Cardiovascular: Negative.   Gastrointestinal: Negative.   Endocrine: Negative.   Genitourinary: Negative.   Musculoskeletal: Positive for back pain.  Skin: Negative.   Allergic/Immunologic: Negative.   Neurological: Negative.   Hematological: Negative.   Psychiatric/Behavioral: Negative.   All other systems reviewed and are negative.      Objective:    BP 122/80   Pulse 97  Ht 5\' 6"  (1.676 m)   Wt (!) 307 lb (139.3 kg)   LMP 12/01/2016   SpO2 99%   BMI 49.55 kg/m    Physical Exam   General:  Obese, Well-developed,well-nourished,in no acute distress; alert,appropriate and cooperative throughout examination Head:  normocephalic and atraumatic.   Eyes:  vision grossly intact, PERRL Ears:  R ear normal and L ear normal externally, TMs clear bilaterally Nose:  no external deformity.   Mouth:  good dentition.   Neck:  No deformities, masses, or tenderness noted. Breasts:  No mass, nodules, thickening, tenderness, bulging, retraction, inflamation, nipple discharge or skin changes noted.   Lungs:  Normal respiratory effort, chest expands symmetrically. Lungs are clear to auscultation, no crackles or  wheezes. Heart:  Normal rate and regular rhythm. S1 and S2 normal without gallop, murmur, click, rub or other extra sounds. Abdomen:  Bowel sounds positive,abdomen soft and non-tender without masses, organomegaly or hernias noted. Msk:  No deformity or scoliosis noted of thoracic or lumbar spine.   Extremities:  No clubbing, cyanosis, edema, or deformity noted with normal full range of motion of all joints.   Neurologic:  alert & oriented X3 and gait normal.   Skin:  Intact without suspicious lesions or rashes Cervical Nodes:  No lymphadenopathy noted Axillary Nodes:  No palpable lymphadenopathy Psych:  Cognition and judgment appear intact. Alert and cooperative with normal attention span and concentration. No apparent delusions, illusions, hallucinations       Assessment & Plan:   Routine general medical examination at a health care facility - Plan: CBC with Differential/Platelet, Comprehensive metabolic panel, Lipid panel, TSH  Ulcerative rectosigmoiditis with complication (HCC) No Follow-up on file.

## 2016-12-13 NOTE — Assessment & Plan Note (Signed)
Intermittent- advised trial of Flonase. Call or return to clinic prn if these symptoms worsen or fail to improve as anticipated. The patient indicates understanding of these issues and agrees with the plan.

## 2016-12-13 NOTE — Assessment & Plan Note (Signed)
Reviewed preventive care protocols, scheduled due services, and updated immunizations Discussed nutrition, exercise, diet, and healthy lifestyle.  Orders Placed This Encounter  Procedures  . CBC with Differential/Platelet  . Comprehensive metabolic panel  . Lipid panel  . TSH     

## 2016-12-13 NOTE — Assessment & Plan Note (Signed)
In remission.

## 2017-07-02 ENCOUNTER — Encounter: Payer: Self-pay | Admitting: Family Medicine

## 2017-07-02 ENCOUNTER — Ambulatory Visit: Payer: BLUE CROSS/BLUE SHIELD | Admitting: Family Medicine

## 2017-07-02 VITALS — BP 136/78 | HR 116 | Temp 99.4°F | Ht 66.0 in | Wt 310.4 lb

## 2017-07-02 DIAGNOSIS — J069 Acute upper respiratory infection, unspecified: Secondary | ICD-10-CM

## 2017-07-02 MED ORDER — HYDROCODONE-HOMATROPINE 5-1.5 MG/5ML PO SYRP: 5.0000 mL | ORAL_SOLUTION | Freq: Three times a day (TID) | ORAL | 0 refills | Status: AC | PRN

## 2017-07-02 NOTE — Progress Notes (Signed)
Subjective:   Patient ID: Veronica Conway, female    DOB: [birth year], 37 y.o.   MRN: [id number]  Veronica Conway is a pleasant 37 y.o. year old female who presents to clinic today with Cough (Patient is here today C/O a cough that started 2 weeks ago. Cough is productive with color ranging from clear to green of sputum. Has been having more headaches than normal.  Clear nasal mucosa.  Post-nasal drip.) and Weight Loss (Patient would also like to discuss options for weight loss.)  on 07/02/2017  HPI:  Cough- progressing for two weeks.  Becoming less productive- mucous has been clear lately and had been greenish and thcik. +PND  No fever.  Cough is keeping her up at night.  Obesity- has tried "every" diet. Has looked into bariatric surgery.   [facility].    Current Outpatient Medications on File Prior to Visit  Medication Sig Dispense Refill  . acetaminophen (TYLENOL) 500 MG tablet Take 500 mg by mouth as needed.    Marland Kitchen. levonorgestrel (MIRENA) 20 MCG/24HR IUD 1 each by Intrauterine route once.     No current facility-administered medications on file prior to visit.     No Known Allergies  Past Medical History:  Diagnosis Date  . GERD (gastroesophageal reflux disease)   . Hay fever   . UC (ulcerative colitis) (HCC)   . Vaginal delivery 2003, 2006    No past surgical history on file.  Family History  Problem Relation Age of Onset  . Heart disease Father   . Mental illness Paternal Grandmother   . Breast cancer Paternal Grandmother   . Alcohol abuse Paternal Grandfather   . Heart disease Paternal Grandfather   . Hypertension Paternal Grandfather     Social History   Socioeconomic History  . Marital status: Married    Spouse name: Not on file  . Number of children: Not on file  . Years of education: Not on file  . Highest education level: Not on file  Social Needs  . Financial resource strain: Not on file  . Food insecurity - worry: Not on  file  . Food insecurity - inability: Not on file  . Transportation needs - medical: Not on file  . Transportation needs - non-medical: Not on file  Occupational History  . Occupation: Consulting civil engineerprogram coordinater    Employer: [employer]  Tobacco Use  . Smoking status: Never Smoker  . Smokeless tobacco: Never Used  Substance and Sexual Activity  . Alcohol use: Yes    Comment: occasional  . Drug use: No  . Sexual activity: Not on file  Other Topics Concern  . Not on file  Social History Narrative  . Not on file   The PMH, PSH, Social History, Family History, Medications, and allergies [facility], and have been updated if relevant.  Review of Systems  Constitutional: Negative for fever.  HENT: Positive for congestion, postnasal drip, rhinorrhea, sinus pressure, sinus pain and sore throat.   Respiratory: Positive for cough. Negative for shortness of breath, wheezing and stridor.   Gastrointestinal: Negative.   Genitourinary: Negative.   All other systems reviewed and are negative.      Objective:    BP 136/78 (BP Location: Left Arm, Patient Position: Sitting, Cuff Size: Large)   Pulse (!) 116   Temp 99.4 F (37.4 C) (Oral)   Ht 5\' 6"  (1.676 m)   Wt Marland Kitchen(!)  310 lb 6.4 oz (140.8 kg)   LMP 06/19/2017   SpO2 97%   BMI 50.10 kg/m    Physical Exam  Constitutional: She is oriented to person, place, and time. She appears well-developed and well-nourished. No distress.  HENT:  Head: Normocephalic and atraumatic.  Right Ear: External ear normal.  Left Ear: External ear normal.  Eyes: Conjunctivae are normal.  Neck: Normal range of motion.  Cardiovascular: Normal rate and regular rhythm.  Pulmonary/Chest: Effort normal and breath sounds normal.  Neurological: She is alert and oriented to person, place, and time. No cranial nerve deficit.  Skin: Skin is warm and dry. She is not diaphoretic.  Psychiatric: She has a normal mood and  affect. Her behavior is normal. Judgment and thought content normal.  Nursing note and vitals reviewed.         Assessment & Plan:   Upper respiratory tract infection, unspecified type No Follow-up on file.

## 2017-07-02 NOTE — Assessment & Plan Note (Signed)
Likely viral and seems to be resolving. Symptomatic therapy suggested: push fluids, rest and return office visit prn if symptoms persist or worsen. Lack of antibiotic effectiveness discussed with her. Call or return to clinic prn if these symptoms worsen or fail to improve as anticipated. Hycodan for severe cough.

## 2017-07-02 NOTE — Assessment & Plan Note (Signed)
Refer for surgery- prefers [facility]. Orders Placed This Encounter  Procedures  . Ambulatory referral to General Surgery

## 2017-07-02 NOTE — Patient Instructions (Signed)
Great to see you. [facility]  Hycodan as needed for cough (this will make you sleepy).

## 2017-11-28 ENCOUNTER — Encounter: Payer: Self-pay | Admitting: Family Medicine

## 2017-11-28 NOTE — Progress Notes (Signed)
[facility]- Out pt nutrition Svcs/thx dmf 

## 2017-11-28 NOTE — Progress Notes (Signed)
[facility]/thx dmf

## 2017-12-07 ENCOUNTER — Encounter: Payer: Self-pay | Admitting: Family Medicine

## 2017-12-31 ENCOUNTER — Encounter: Payer: BLUE CROSS/BLUE SHIELD | Admitting: Family Medicine

## 2018-07-05 ENCOUNTER — Encounter: Payer: Self-pay | Admitting: Family Medicine

## 2018-09-11 ENCOUNTER — Telehealth: Payer: Self-pay | Admitting: Family Medicine

## 2018-09-11 NOTE — Telephone Encounter (Signed)
Called and could not leave vm due to no DPR. Calling to schedule virtual visit for patient with Dr. Dayton Martes.

## 2019-08-04 ENCOUNTER — Ambulatory Visit: Payer: Self-pay | Attending: Internal Medicine

## 2019-08-04 DIAGNOSIS — Z23 Encounter for immunization: Secondary | ICD-10-CM | POA: Insufficient documentation

## 2019-08-04 NOTE — Progress Notes (Signed)
   Covid-19 Vaccination Clinic  Name:  Veronica Conway    MRN: [id number] DOB: [birth year]  08/04/2019  [name] was observed post Covid-19 immunization for 15 minutes without incident. She was provided with Vaccine Information Sheet and instruction to access the V-Safe system.   [name] was instructed to call 911 with any severe reactions post vaccine: Marland Kitchen Difficulty breathing  . Swelling of face and throat  . A fast heartbeat  . A bad rash all over body  . Dizziness and weakness   Immunizations Administered    Name Date Dose VIS Date Route   Pfizer COVID-19 Vaccine 08/04/2019  8:35 AM 0.3 mL 05/09/2019 Intramuscular   Manufacturer: [employer], [employer]   Lot: [id number]   NDC: [address]-4

## 2019-08-27 ENCOUNTER — Ambulatory Visit: Payer: Self-pay

## 2019-08-28 ENCOUNTER — Ambulatory Visit: Payer: Self-pay | Attending: Internal Medicine

## 2019-08-28 DIAGNOSIS — Z23 Encounter for immunization: Secondary | ICD-10-CM

## 2019-08-28 NOTE — Progress Notes (Signed)
   Covid-19 Vaccination Clinic  Name:  SIERAH LACEWELL    MRN: [id number] DOB: May 31, 1980  08/28/2019  Ms. Frampton was observed post Covid-19 immunization for 15 minutes without incident. She was provided with Vaccine Information Sheet and instruction to access the V-Safe system.   Ms. Sabo was instructed to call 911 with any severe reactions post vaccine: Marland Kitchen Difficulty breathing  . Swelling of face and throat  . A fast heartbeat  . A bad rash all over body  . Dizziness and weakness   Immunizations Administered    Name Date Dose VIS Date Route   Pfizer COVID-19 Vaccine 08/28/2019  9:49 AM 0.3 mL 05/09/2019 Intramuscular   Manufacturer: [employer], [employer]   Lot: [id number]   NDC: [address]-7

## 2024-02-13 ENCOUNTER — Emergency Department
Admission: EM | Admit: 2024-02-13 | Discharge: 2024-02-13 | Disposition: A | Discharge status: Home / Self Care | Attending: Emergency Medicine | Admitting: Emergency Medicine

## 2024-02-13 ENCOUNTER — Emergency Department

## 2024-02-13 DIAGNOSIS — M542 Cervicalgia: Secondary | ICD-10-CM | POA: Diagnosis present

## 2024-02-13 DIAGNOSIS — S134XXA Sprain of ligaments of cervical spine, initial encounter: Secondary | ICD-10-CM | POA: Diagnosis not present

## 2024-02-13 DIAGNOSIS — R519 Headache, unspecified: Secondary | ICD-10-CM | POA: Insufficient documentation

## 2024-02-13 DIAGNOSIS — S161XXA Strain of muscle, fascia and tendon at neck level, initial encounter: Secondary | ICD-10-CM | POA: Diagnosis not present

## 2024-02-13 DIAGNOSIS — Y92410 Unspecified street and highway as the place of occurrence of the external cause: Secondary | ICD-10-CM | POA: Diagnosis not present

## 2024-02-13 DIAGNOSIS — H6991 Unspecified Eustachian tube disorder, right ear: Secondary | ICD-10-CM | POA: Diagnosis not present

## 2024-02-13 MED ORDER — METHOCARBAMOL 500 MG PO TABS
500.0000 mg | ORAL_TABLET | Freq: Three times a day (TID) | ORAL | 0 refills | Status: AC | PRN
Start: 2024-02-13 — End: 2024-02-20
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
